# Patient Record
Sex: Female | Born: 1990
Health system: Southern US, Community
[De-identification: ages and names within clinical notes are randomized; demographics above are authoritative.]

## PROBLEM LIST (undated history)

## (undated) DIAGNOSIS — Z8619 Personal history of other infectious and parasitic diseases: Secondary | ICD-10-CM

## (undated) DIAGNOSIS — A630 Anogenital (venereal) warts: Secondary | ICD-10-CM

## (undated) DIAGNOSIS — G43109 Migraine with aura, not intractable, without status migrainosus: Secondary | ICD-10-CM

## (undated) DIAGNOSIS — E039 Hypothyroidism, unspecified: Secondary | ICD-10-CM

## (undated) DIAGNOSIS — E049 Nontoxic goiter, unspecified: Secondary | ICD-10-CM

## (undated) DIAGNOSIS — B002 Herpesviral gingivostomatitis and pharyngotonsillitis: Secondary | ICD-10-CM

## (undated) HISTORY — DX: Nontoxic goiter, unspecified: E04.9

## (undated) HISTORY — DX: Hypothyroidism, unspecified: E03.9

## (undated) HISTORY — PX: CYSTECTOMY: SUR359

## (undated) HISTORY — PX: OTHER SURGICAL HISTORY: SHX169

## (undated) HISTORY — DX: Herpesviral gingivostomatitis and pharyngotonsillitis: B00.2

## (undated) HISTORY — DX: Personal history of other infectious and parasitic diseases: Z86.19

## (undated) HISTORY — DX: Migraine with aura, not intractable, without status migrainosus: G43.109

## (undated) HISTORY — PX: WISDOM TOOTH EXTRACTION: SHX21

## (undated) HISTORY — DX: Anogenital (venereal) warts: A63.0

---
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
## 2008-05-12 ENCOUNTER — Encounter: Payer: Self-pay | Admitting: Family Medicine

## 2008-05-12 ENCOUNTER — Other Ambulatory Visit: Admission: RE | Admit: 2008-05-12 | Discharge: 2008-05-12 | Payer: Self-pay | Admitting: Family Medicine

## 2008-05-12 ENCOUNTER — Ambulatory Visit: Payer: Self-pay | Admitting: Family Medicine

## 2008-05-12 DIAGNOSIS — E049 Nontoxic goiter, unspecified: Secondary | ICD-10-CM

## 2008-05-23 ENCOUNTER — Encounter: Payer: Self-pay | Admitting: Family Medicine

## 2008-05-23 ENCOUNTER — Other Ambulatory Visit: Admission: RE | Admit: 2008-05-23 | Discharge: 2008-05-23 | Payer: Self-pay | Admitting: Family Medicine

## 2008-05-23 ENCOUNTER — Ambulatory Visit: Payer: Self-pay | Admitting: Family Medicine

## 2008-07-08 ENCOUNTER — Ambulatory Visit: Payer: Self-pay | Admitting: Family Medicine

## 2009-03-11 ENCOUNTER — Telehealth: Payer: Self-pay | Admitting: Family Medicine

## 2009-04-08 ENCOUNTER — Ambulatory Visit: Payer: Self-pay | Admitting: Family Medicine

## 2009-04-08 LAB — CONVERTED CEMR LAB
Nitrite: POSITIVE
Specific Gravity, Urine: 1.015
Urobilinogen, UA: 0.2
pH: 7

## 2009-06-23 ENCOUNTER — Ambulatory Visit: Payer: Self-pay | Admitting: Family Medicine

## 2009-06-23 LAB — CONVERTED CEMR LAB
KOH Prep: NEGATIVE
Whiff Test: NEGATIVE

## 2009-06-24 LAB — CONVERTED CEMR LAB
Chlamydia, DNA Probe: NEGATIVE
GC Probe Amp, Genital: NEGATIVE

## 2009-09-07 ENCOUNTER — Telehealth: Payer: Self-pay | Admitting: Family Medicine

## 2009-10-26 ENCOUNTER — Telehealth: Payer: Self-pay | Admitting: Family Medicine

## 2009-11-13 ENCOUNTER — Ambulatory Visit: Payer: Self-pay | Admitting: Family Medicine

## 2009-11-13 DIAGNOSIS — E039 Hypothyroidism, unspecified: Secondary | ICD-10-CM

## 2009-11-16 ENCOUNTER — Telehealth: Payer: Self-pay | Admitting: Family Medicine

## 2009-11-18 LAB — CONVERTED CEMR LAB
Free T4: 1.29 ng/dL (ref 0.80–1.80)
TSH: 1.218 microintl units/mL (ref 0.350–4.500)

## 2010-05-13 ENCOUNTER — Ambulatory Visit: Payer: Self-pay | Admitting: Family Medicine

## 2010-05-13 DIAGNOSIS — E7800 Pure hypercholesterolemia, unspecified: Secondary | ICD-10-CM

## 2010-05-14 LAB — CONVERTED CEMR LAB
Cholesterol: 141 mg/dL (ref 0–200)
HDL: 51.2 mg/dL (ref >39.00)
LDL Cholesterol: 80 mg/dL (ref 0–99)
TSH: 0.98 microintl units/mL (ref 0.35–5.50)
Total CHOL/HDL Ratio: 3

## 2010-06-03 ENCOUNTER — Telehealth: Payer: Self-pay | Admitting: Family Medicine

## 2010-07-13 ENCOUNTER — Ambulatory Visit: Payer: Self-pay | Admitting: Internal Medicine

## 2010-11-02 NOTE — Progress Notes (Signed)
Summary: thyroid  Phone Note Call from Patient Call back at [phone]   Caller: Mom Call For: Robin Part MD Summary of Call: Patients mother calling wanting to know when patient last had thyroid checked. I looked in chart but, do not see labs in there. Patient mother also states that daughter being swiched for brand name to generic and it is causing her alot of problams. Have not advised  mother of anything because i did not know about rather she was a contact for the patient since she is 20 yr old. Please advise Initial call taken by: Benny Lennert CMA Duncan Dull),  October 26, 2009 12:41 PM  Follow-up for Phone Call        she is due for visit and labs-- please call pt to schedule  Follow-up by: Robin Part MD,  October 26, 2009 1:40 PM  Additional Follow-up for Phone Call Additional follow up Details #1::        Advised pt's mother that pt needs to call to schedule office visit.    Additional Follow-up by: Lowella Petties CMA,  October 26, 2009 2:22 PM

## 2010-11-02 NOTE — Assessment & Plan Note (Signed)
Summary: F/U,LABWORK/CLE   Vital Signs:  Patient profile:   20 year old female Height:      66 inches Weight:      142.75 pounds BMI:     23.12 Temp:     98 degrees F oral Pulse rate:   72 / minute Pulse rhythm:   regular BP sitting:   100 / 60  (left arm) Cuff size:   regular  Vitals Entered By: Lewanda Rife LPN (November 13, 2009 4:01 PM)  History of Present Illness: here to check on thyroid  does not like generic synthroid   has been tired more than normal  extremely tired  is busy with work and school -- even if she does get 8 hours of sleep   not as much exercise as she used to  on feet at [employer] - not sitting at all  does eat a balanced diet and wt is the same   has been hypothyroid for 4-5 years  wants to switch to synthroid -- generic  was on it for a while   was switched to levothyroixine - ? 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 Eyes:  Denies blurring, double vision, and eye irritation. ENT:  Denies sore throat. CV:  Denies chest pain or discomfort, lightheadness, and palpitations. Resp:  Denies cough and shortness of breath. GI:  Denies abdominal pain, change in  bowel habits, and nausea. MS:  Denies joint pain. Derm:  Denies lesion(s), poor wound healing, and rash. Neuro:  Denies headaches, numbness, tingling, and weakness. Psych:  Denies anxiety and depression. Endo:  Denies cold intolerance, excessive thirst, excessive urination, and heat intolerance. Heme:  Denies abnormal bruising and bleeding.  Physical Exam  General:  Well-developed,well-nourished,in no acute distress; alert,appropriate and cooperative throughout examination Head:  normocephalic, atraumatic, and no abnormalities observed.   Eyes:  vision grossly intact, pupils equal, pupils round, and pupils reactive to light.   Mouth:  pharynx pink and moist.   Neck:  stable symmetric goiter that is nontender and without thyroid bruits  Lungs:  Normal respiratory effort, chest expands symmetrically. Lungs are clear to auscultation, no crackles or wheezes. Heart:  Normal rate and regular rhythm. S1 and S2 normal without gallop, murmur, click, rub or other extra sounds. Msk:  No deformity or scoliosis noted of thoracic or lumbar spine.   Extremities:  No clubbing, cyanosis, edema, or deformity noted with normal full range of motion of all joints.   Neurologic:  sensation intact to light touch, gait normal, and DTRs symmetrical and normal.   Skin:  Intact without suspicious lesions or rashes Cervical Nodes:  No lymphadenopathy noted Psych:  normal affect, talkative and pleasant  Impression & Recommendations:  Problem # 1:  HYPOTHYROIDISM (ICD-244.9) Assessment Unchanged  overall more fatigue with brand change pt desires generic synthroid  will check thyroid prof and then refil no change in goiter  then re check 6 mo  Her updated medication list for this problem includes:    Levoxyl 50 Mcg Tabs (Levothyroxine sodium) ..... One by mouth every day  Orders: Venipuncture (16109) T-TSH (60454-09811) T-T4, Free [phone]) T-T3 Uptake [phone]) Specimen Handling  (99000)  Complete Medication List: 1)  Valtrex 1 Gm Tabs (Valacyclovir hcl) .... 2 by mouth two times a day for 1 day as needed cold sore 2)  Zovirax 5 % Crea (Acyclovir) .... Apply to affected area four times daily as needed 3)  Levoxyl 50 Mcg Tabs (Levothyroxine sodium) .... One by mouth every day 4)  Tylenol 325 Mg Tabs (Acetaminophen) .... Otc as directed  Patient Instructions: 1)  labs today 2)  when labs return - will refil med as generic synthroid  3)  update me if any symptoms or problems  4)  schedule non fasting lab in 6 months for tsh , and lipids 272   Current Allergies (reviewed today): No known allergies

## 2010-11-02 NOTE — Progress Notes (Signed)
Summary: pt wants name brand synthroid  Phone Note Call from Patient Call back at [phone], [phone]   Caller: Patient Call For: Judith Part MD Summary of Call: Mother states pt needs to be taking name brand synthroid, not generic.  [facility]. Initial call taken by: Lowella Petties CMA,  November 16, 2009 11:30 AM  Follow-up for Phone Call        the PATIENT told me she wanted generic - just for the record  fine to change to DAW- but please warn her it may cost more px written on EMR for call in  Follow-up by: Judith Part MD,  November 16, 2009 11:52 AM  Additional Follow-up for Phone Call Additional follow up Details #1::        Spoke with pt's mom and she does want name brand. Medication [facility]oned [facility] as instructed. Spoke with Leonette Most at [facility] [employer].Lewanda Rife LPN  November 16, 2009 2:32 PM     New/Updated Medications: * SYNTHROID 50 MCG TABS (LEVOTHYROXINE SODIUM)  DAW 1 by mouth once daily [BMN] Prescriptions: SYNTHROID 50 MCG TABS (LEVOTHYROXINE SODIUM)  DAW 1 by mouth once daily Brand medically necessary #30 x 11   Entered and Authorized by:   Judith Part MD   Signed by:   Judith Part MD on 11/16/2009   Method used:   Telephoned to ...       [facility] [facility]ail)       [address]       [place], [place]  [address]       Ph: [phone] or [phone]       Fax: [phone]   RxID:   [id number]

## 2010-11-02 NOTE — Progress Notes (Signed)
Summary: Valacyclovir 1gm refill  Phone Note Refill Request Call back at [phone] Message from:  [facility] on June 03, 2010 3:41 PM  Refills Requested: Medication #1:  VALTREX 1 GM TABS 2 by mouth two times a day for 1 day as needed cold sore [facility] electronically request refill on Valacyclovir 1gm taking 2 tabs by mouth twice a day. No refill date sent. Please advise.    Method Requested: Telephone to Pharmacy Initial call taken by: Lewanda Rife LPN,  June 03, 2010 3:43 PM  Follow-up for Phone Call        px written on EMR for call in  Follow-up by: Judith Part MD,  June 04, 2010 8:03 AM  Additional Follow-up for Phone Call Additional follow up Details #1::        Sent to pharmacy. Additional Follow-up by: Lowella Petties CMA,  June 04, 2010 8:57 AM    Prescriptions: VALTREX 1 GM TABS (VALACYCLOVIR HCL) 2 by mouth two times a day for 1 day as needed cold sore  #4 x 2   Entered by:   Lowella Petties CMA   Authorized by:   Judith Part MD   Signed by:   Lowella Petties CMA on 06/04/2010   Method used:   Electronically to        [facility] (retail)       [address]       [place], [place]  [address]       Ph: [phone] or [phone]       Fax: [phone]   RxID:   [id number]

## 2010-11-02 NOTE — Assessment & Plan Note (Signed)
Summary: BUMP ON BACK OF LEG POSSIBLE SPIDER BITE???/JRR   Vital Signs:  Patient profile:   20 year old female Weight:      145.75 pounds Temp:     98.5 degrees F oral Pulse rate:   64 / minute Pulse rhythm:   regular BP sitting:   108 / 64  (left arm) Cuff size:   regular  Vitals Entered By: Selena Batten Dance CMA Duncan Dull) (July 13, 2010 4:08 PM) CC: check bump on leg   History of Present Illness: CC: spots on back of leg  several month h/o brown spot on back of R leg, then yesterday became bump, looked like whitehead.  Boyfriend popped it 2/2 pain, did get some pus out, still hurting.  Hasn't tried anything else.    Current Medications (verified): 1)  Valtrex 1 Gm Tabs (Valacyclovir Hcl) .... 2 By Mouth Two Times A Day For 1 Day As Needed Cold Sore 2)  Zovirax 5 % Crea (Acyclovir) .... Apply To Affected Area Four Times Daily As Needed 3)  Synthroid 50 Mcg Tabs (Levothyroxine Sodium)  Daw .... 1 By Mouth Once Daily 4)  Tylenol 325 Mg Tabs (Acetaminophen) .... 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  no fluctuance.  Small central dark spot on papule   Impression & Recommendations:  Problem # 1:  SKIN LESION (ICD-709.9) looks like solitary whitehead, irritated after popping last night at home.  given recent trauma to area, possible contamination and risk of MRSA, treat with doxy for 1 wk.  encouraged  to continue warm soaks for next several days, return if not imporving with these measures.  return sooner if coming to head.  Complete Medication List: 1)  Valtrex 1 Gm Tabs (Valacyclovir hcl) .... 2 by mouth two times a day for 1 day as needed cold sore 2)  Zovirax 5 % Crea (Acyclovir) .... Apply to affected area four times daily as needed 3)  Synthroid 50 Mcg Tabs (levothyroxine Sodium) Daw  .... 1 by mouth once daily 4)  Tylenol 325 Mg Tabs (Acetaminophen) .... Otc as directed 5)  Doxycycline Hyclate 100 Mg Caps (Doxycycline hyclate) .... Take one by mouth two times a day x 7 days  Patient Instructions: 1)  could have been whitehead, now looking irritated and inflammed. 2)  Recommend start warm compresses three times a day, return if seems like coming to a head. 3)  Course of antibiotics for next week (one twice daily) (photosensitivity precautions). Prescriptions: DOXYCYCLINE HYCLATE 100 MG CAPS (DOXYCYCLINE HYCLATE) take one by mouth two times a day x 7 days  #14 x 0   Entered and Authorized by:   Eustaquio Boyden  MD   Signed by:   Eustaquio Boyden  MD on 07/13/2010   Method used:   [facility]ronically to        [phone] (retail)       [address]       [place], [place]  [address]       Ph: [phone] or [phone]       Fax: [phone]   RxID:   [id number]   Current Allergies (reviewed today): No known allergies

## 2010-11-15 ENCOUNTER — Encounter: Payer: Self-pay | Admitting: Family Medicine

## 2010-11-15 ENCOUNTER — Other Ambulatory Visit: Payer: Self-pay | Admitting: Family Medicine

## 2010-11-15 ENCOUNTER — Ambulatory Visit (INDEPENDENT_AMBULATORY_CARE_PROVIDER_SITE_OTHER): Payer: PRIVATE HEALTH INSURANCE | Admitting: Family Medicine

## 2010-11-15 DIAGNOSIS — E049 Nontoxic goiter, unspecified: Secondary | ICD-10-CM

## 2010-11-15 DIAGNOSIS — E039 Hypothyroidism, unspecified: Secondary | ICD-10-CM

## 2010-11-15 DIAGNOSIS — E7800 Pure hypercholesterolemia, unspecified: Secondary | ICD-10-CM

## 2010-11-16 LAB — TSH: TSH: 0.74 u[IU]/mL (ref 0.35–5.50)

## 2010-11-24 NOTE — Assessment & Plan Note (Signed)
Summary: ROA FOR FOLLOW-UP/JRR   Vital Signs:  Patient profile:   20 year old female Height:      66 inches Weight:      149 pounds BMI:     24.14 Temp:     98 degrees F oral Pulse rate:   64 / minute Pulse rhythm:   regular BP sitting:   96 / 64  (left arm) Cuff size:   regular  Vitals Entered By: Lewanda Rife LPN (November 15, 2010 3:57 PM) CC: six month f/u   History of Present Illness: here for 6 month f/u of hypothyroidism and hyperlipidemia   feels good  is fine - no medical changes at all   wt is up 4 lb -- eating more in wintertime - stopped working out [employer]  bought a treadmill  also working  last tsh good in summer  does not feel like thyroid has changed in size or nature  no problems with med   Last Lipid ProfileCholesterol: 141 (05/13/2010 8:44:03 AM)HDL:  51.20 (05/13/2010 8:44:03 AM)LDL:  80 (05/13/2010 8:44:03 AM)Triglycerides:  Last Liver profileSGOT:  SPGT:  T. 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 Eyes:  Denies blurring and eye irritation. CV:  Denies chest pain or discomfort, fatigue, lightheadness, and palpitations. Resp:  Denies cough,  shortness of breath, and wheezing. GI:  Denies indigestion and nausea. GU:  Denies urinary frequency. MS:  Denies muscle aches and cramps. Derm:  Denies itching, lesion(s), poor wound healing, and rash. Neuro:  Denies numbness and tingling. Psych:  Denies anxiety and depression. Endo:  Denies cold intolerance, excessive thirst, excessive urination, and heat intolerance. Heme:  Denies abnormal bruising and bleeding.  Physical Exam  General:  Well-developed,well-nourished,in no acute distress; alert,appropriate and cooperative throughout examination Head:  normocephalic, atraumatic, and no abnormalities observed.   Eyes:  vision grossly intact, pupils equal, pupils round, and pupils reactive to light.  no conjunctival pallor, injection or icterus  Mouth:  pharynx pink and moist.   Neck:  stable symmetric goiter that is nontender and without thyroid bruits  Chest Wall:  No deformities, masses, or tenderness noted. Lungs:  Normal respiratory effort, chest expands symmetrically. Lungs are clear to auscultation, no crackles or wheezes. Heart:  Normal rate and regular rhythm. S1 and S2 normal without gallop, murmur, click, rub or other extra sounds. Extremities:  No clubbing, cyanosis, edema, or deformity noted with normal full range of motion of all joints.   Neurologic:  sensation intact to light touch, gait normal, and DTRs symmetrical and normal.  no tremor  Skin:  Intact without suspicious lesions or rashes Cervical Nodes:  No lymphadenopathy noted Inguinal Nodes:  No significant adenopathy Psych:  normal affect, talkative and pleasant    Impression & Recommendations:  Problem # 1:  HYPOTHYROIDISM (ICD-244.9) Assessment Improved  with goiter that is stable and unchanged no clinical changes- overall a bit more energy lab today and update Orders: Venipuncture (16109) TLB-TSH (Thyroid Stimulating Hormone) (84443-TSH) TLB-T4 (Thyrox), Free [phone])  Labs Reviewed: TSH: 0.98  (05/13/2010)    Chol: 141 (05/13/2010)   HDL: 51.20 (05/13/2010)   LDL: 80 (05/13/2010)   TG: 49.0 (05/13/2010)  Problem # 2:  PURE HYPERCHOLESTEROLEMIA (ICD-272.0) Assessment: Improved  this was excellent at last visit  urged to keep  up the good diet and add back exercise     HDL:51.20 (05/13/2010)  LDL:80 (05/13/2010)  Chol:141 (05/13/2010)  Trig:49.0 (05/13/2010)  Complete Medication List: 1)  Valtrex 1 Gm Tabs (Valacyclovir hcl) .... 2 by mouth two times a day for 1 day as needed cold sore 2)  Zovirax 5 % Crea (Acyclovir) .... Apply to affected area four times daily as needed 3)  Synthroid 50 Mcg Tabs (levothyroxine Sodium) Daw  .... 1 by mouth once daily 4)  Tylenol Extra Strength 500 Mg Tabs (Acetaminophen) .... Otc as directed.  Patient Instructions: 1)  labs today  2)  will advise you if we need to change dose 3)  get back to exercise  4)  if neck size changes let me know  Prescriptions: SYNTHROID 50 MCG TABS (LEVOTHYROXINE SODIUM)  DAW 1 by mouth once daily Brand medically necessary #30 x 11   Entered and Authorized by:   Judith Part MD   Signed by:   Judith Part MD on 11/15/2010   Method used:   Print then Give to Patient   RxID:   [id number]    Orders Added: 1)  Venipuncture [36415] 2)  TLB-TSH (Thyroid Stimulating Hormone) [84443-TSH] 3)  TLB-T4 (Thyrox), Free [57846-NG2X] 4)  Est. Patient Level III [52841]    Current Allergies (reviewed today): No known allergies

## 2011-02-18 NOTE — Op Note (Signed)
[place]. [facility]  Patient:    Robin Pollard                         MRN: [id number] Attending:  Evalee Mutton. Leeanne Mannan, M.D. CC:         Janeece Riggers, M.D.   Operative Report  DATE OF BIRTH:  03/15/1991.  PREOPERATIVE DIAGNOSIS:  Abdominal mass, possible ovarian cyst.  POSTOPERATIVE DIAGNOSIS:  Abdominal mass, possible ovarian cyst.  PROCEDURE: 1. Exploratory laparotomy and excision of ovarian cyst. 2. Appendectomy.  ANESTHESIA:  General endotracheal tube.  SURGEON:  Evalee Mutton. Leeanne Mannan, M.D.  ASSISTANTDonnella Bi D. Pendse, M.D.  INDICATION FOR PROCEDURE:  This 20 year old female child presented to her pediatrician with abdominal discomfort, who discovered a large abdominal mass, which appeared to be a cystic mass of possibly ovarian origin.  Preoperative preparations were done to do an exploratory laparotomy, excision of the mass.  DESCRIPTION OF PROCEDURE:  The patient was brought into the operating room and placed on the operating table.  Once general endotracheal anesthesia was given, the abdominal wall was cleaned and draped in the usual manner.  A curvilinear skin crease was made in the lower abdomen, measuring about 8-10 cm, deepened through the subcutaneous tissue, and the peritoneum was opened. The large cystic mass was instantly visible, which had its origin from the left ovary.  It was difficult to deliver it through the incision, limited by the skin incision, which was extended for about a centimeter on each side, and the mass was delivered out of the peritoneal cavity.  The mass measured over 20 cm in diameter, and tense cystic involving the entire left ovary.  The tube appeared to be intact and separate.  The right ovary and tube were inspected and found to be normal.  The uterus appeared a normal size.  The fluid was collected from the pelvis for cytology.  The rest of the viscera were also inspected.  The liver was palpated,  which was palpably and visibly normal. Both the kidneys were palpably normal.  No other abnormality or abdominal lymphadenitis was noted in the peritoneal cavity.  The cyst was easily separated, and the large vessels were clamped and divided between clamps and ligated with 2-0 silk, and the large cyst was removed from the field.  The left tube remained intact; however, the entire left ovary was involved with the cystic mass, which was excised.  At this point, we also decided to remove the appendix, and the appendix, which was long, tortuous, and retrocecal, was pulled out by pulling the cecum out and the mesoappendix was divided between clamps and ligated using 3-0 silk.  The base of the appendix was crushed, ligated with 2-0 chromic catgut.  The appendix was divided with a knife.  The stump of the appendix was buried under pursestring suture taken by 3-0 GI silk, and the stump was ducked into the cecum without any difficulty by tying the pursestring sutures.  The entire peritoneal cavity was irrigated with copious amount of normal saline, and the abdomen was once again inspected for any bleeders or oozers.  No bleeding or oozing was noted.  The abdominal cavity was closed in layers.  The peritoneal layer was closed with 2-0 Vicryl continuous sutures, the muscle of the sheath was sutured with 2-0 Vicryl interrupted sutures, the subcutaneous layer with 2-0 interrupted Vicryl sutures, and the skin with 4-0 Monocryl subcuticular stitch.  Steri-Strips were applied, securing  the skin.  The wound was irrigated with a copious amount of normal saline.  After closing the skin, the Steri-Strips were applied, which was covered with gauze dressing and OpSite.  Before applying the dressing, about 20 cc of 0.25% Marcaine with epinephrine was injected for postoperative pain control.  The patient tourniquet time very well, which was more than uneventful.  The estimated blood loss was less than 50 cc.   The total amount of IV fluids received by the patient was 800 cc.  The patient made about 150 cc of urine.  The course of the procedure was very smooth and uneventful.  The patient was later extubated and transported to the recovery room in a stable condition. DD:  05/16/00 TD:  05/16/00 Job: [id number]

## 2011-04-09 ENCOUNTER — Encounter: Payer: Self-pay | Admitting: Family Medicine

## 2011-04-11 ENCOUNTER — Ambulatory Visit (INDEPENDENT_AMBULATORY_CARE_PROVIDER_SITE_OTHER): Payer: PRIVATE HEALTH INSURANCE | Admitting: Family Medicine

## 2011-04-11 ENCOUNTER — Encounter: Payer: Self-pay | Admitting: Family Medicine

## 2011-04-11 VITALS — BP 100/70 | HR 68 | Temp 97.9°F | Ht 66.0 in | Wt 146.5 lb

## 2011-04-11 DIAGNOSIS — Z021 Encounter for pre-employment examination: Secondary | ICD-10-CM

## 2011-04-11 DIAGNOSIS — Z0289 Encounter for other administrative examinations: Secondary | ICD-10-CM

## 2011-04-11 DIAGNOSIS — Z23 Encounter for immunization: Secondary | ICD-10-CM

## 2011-04-11 MED ORDER — TUBERCULIN PPD 5 UNIT/0.1ML ID SOLN
5.0000 [IU] | Freq: Once | INTRADERMAL | Status: AC
  Administered 2011-04-11: 5 [IU] via INTRADERMAL

## 2011-04-11 NOTE — Progress Notes (Signed)
Subjective:    Patient ID: Robin Pollard, female    DOB: 1991/02/13, 20 y.o.   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 HUMAN PAPILLOMAVIRUS  . GOITER  . HYPOTHYROIDISM  . PURE HYPERCHOLESTEROLEMIA  . ABFND PAP SMEAR LGSIL  . Pre-employment examination   Past Medical History  Diagnosis Date  . Oral herpes   . Goiter     Hashimoto's thyroiditis  . HPV (human papilloma virus) anogenital infection     not high risk   Past Surgical History  Procedure Date  . Teratoma removal    History  Substance Use Topics  . Smoking status: Never Smoker   . Smokeless tobacco: Not on file  . Alcohol Use: No   Family History  Problem Relation Age of Onset  . Heart disease Maternal Grandmother     CAD  . Diabetes Maternal Grandmother   . Heart disease Maternal Grandfather     CAD  . Diabetes Maternal Grandfather   . Heart disease Paternal Grandfather     CAD   No Known Allergies Current Outpatient Prescriptions on File Prior to Visit  Medication Sig Dispense Refill  . levothyroxine (SYNTHROID, LEVOTHROID) 50 MCG tablet Take 50 mcg by mouth daily.        Marland Kitchen acetaminophen (TYLENOL) 500 MG tablet Take 500 mg by mouth every 6 (six) hours as needed.        Marland Kitchen acyclovir (ZOVIRAX) 5 % cream Apply to affected area four times daily as needed.       . valACYclovir (VALTREX) 1000 MG tablet Take 2,000 mg by mouth 2 (two) times daily. for 1 day as needed for cold  sore.             Review of Systems Review of Systems  Constitutional: Negative for fever, appetite change, fatigue and unexpected weight change.  Eyes: Negative for pain and visual disturbance.  Respiratory: Negative for cough and shortness of breath.   Cardiovascular: Negative.   Gastrointestinal: Negative for nausea, diarrhea and constipation.  Genitourinary: Negative for urgency and frequency.  Skin: Negative for pallor.  Neurological: Negative for weakness, light-headedness, numbness and headaches.  Hematological: Negative for adenopathy. Does not bruise/bleed easily.  Psychiatric/Behavioral: Negative for dysphoric mood. The patient is not nervous/anxious.          Objective:   Physical Exam  Constitutional: She appears well-developed and well-nourished. No distress.  HENT:  Head: Normocephalic and atraumatic.  Right Ear: External ear normal.  Left Ear: External ear normal.  Nose: Nose normal.  Mouth/Throat: Oropharynx is clear and moist.  Eyes: Conjunctivae and EOM are normal. Pupils are equal, round, and reactive to light.  Neck: Normal range of motion. Neck supple. No JVD present. Carotid bruit is not present. No thyromegaly present.  Cardiovascular: Normal rate, regular rhythm, normal heart  sounds and intact distal pulses.   No murmur heard. Pulmonary/Chest: Effort normal and breath sounds normal. No respiratory distress. She has no wheezes.  Abdominal: Soft. Bowel sounds are normal. She exhibits no distension and no mass. There is no tenderness.  Musculoskeletal: Normal range of motion. She exhibits no edema and no tenderness.  Lymphadenopathy:    She has no cervical adenopathy.  Neurological: She is alert. She has normal reflexes. Coordination normal.  Skin: Skin is warm and dry. No rash noted. No erythema. No pallor.  Psychiatric: She has a normal mood and affect.          Assessment & Plan:

## 2011-04-11 NOTE — Assessment & Plan Note (Signed)
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

## 2011-04-11 NOTE — Patient Instructions (Signed)
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

## 2011-04-13 ENCOUNTER — Ambulatory Visit (INDEPENDENT_AMBULATORY_CARE_PROVIDER_SITE_OTHER): Payer: PRIVATE HEALTH INSURANCE | Admitting: Family Medicine

## 2011-04-13 DIAGNOSIS — Z111 Encounter for screening for respiratory tuberculosis: Secondary | ICD-10-CM

## 2011-04-13 DIAGNOSIS — IMO0001 Reserved for inherently not codable concepts without codable children: Secondary | ICD-10-CM

## 2011-04-14 NOTE — Progress Notes (Signed)
  Subjective:    Patient ID: Robin Pollard, female    DOB: Feb 26, 1991, 20 y.o.   MRN: [id number]  HPI  Neg PPD  Review of Systems     Objective:   Physical Exam        Assessment & Plan:

## 2011-06-27 LAB — POCT RAPID STREP A: Streptococcus, Group A Screen (Direct): NEGATIVE

## 2011-12-05 ENCOUNTER — Other Ambulatory Visit: Payer: Self-pay | Admitting: *Deleted

## 2011-12-05 MED ORDER — LEVOTHYROXINE SODIUM 50 MCG PO TABS: 50.0000 ug | ORAL_TABLET | Freq: Every day | ORAL | Status: DC

## 2012-01-09 ENCOUNTER — Telehealth: Payer: Self-pay | Admitting: Family Medicine

## 2012-01-09 NOTE — Telephone Encounter (Signed)
Pt is calling in about Nausea. She is about [redacted] weeks pregnant and is extremely nauseated and has been sick pretty much all day for the last 3 or so days. She has an appointment with Dr. Vincente Poli on 01/17/12 but wasn't sure if she should call their office or here to see about getting something for her nausea.

## 2012-01-09 NOTE — Telephone Encounter (Signed)
Call the OBGYN office to see what they recommend that is the safest  Keep taking sips of fluids most importantly to prevent dehydration

## 2012-01-09 NOTE — Telephone Encounter (Signed)
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

## 2012-01-26 LAB — OB RESULTS CONSOLE ABO/RH

## 2012-01-26 LAB — OB RESULTS CONSOLE HEPATITIS B SURFACE ANTIGEN: Hepatitis B Surface Ag: NEGATIVE

## 2012-01-26 LAB — OB RESULTS CONSOLE ANTIBODY SCREEN: Antibody Screen: NEGATIVE

## 2012-02-09 ENCOUNTER — Other Ambulatory Visit: Payer: Self-pay | Admitting: Obstetrics and Gynecology

## 2012-02-10 ENCOUNTER — Other Ambulatory Visit: Payer: Self-pay | Admitting: *Deleted

## 2012-02-10 DIAGNOSIS — E039 Hypothyroidism, unspecified: Secondary | ICD-10-CM

## 2012-02-10 MED ORDER — LEVOTHYROXINE SODIUM 50 MCG PO TABS: 50.0000 ug | ORAL_TABLET | Freq: Every day | ORAL | Status: DC

## 2012-02-10 NOTE — Telephone Encounter (Signed)
OK to refill? No recent labs. 

## 2012-02-10 NOTE — Telephone Encounter (Signed)
Please schedule labs for thyroid  Have her f/u this summer Will refill electronically

## 2012-02-13 ENCOUNTER — Other Ambulatory Visit (INDEPENDENT_AMBULATORY_CARE_PROVIDER_SITE_OTHER): Payer: BC Managed Care – PPO

## 2012-02-13 DIAGNOSIS — E039 Hypothyroidism, unspecified: Secondary | ICD-10-CM

## 2012-02-13 NOTE — Telephone Encounter (Signed)
Left message on cell phone voicemail advising patient as instructed.  Advised her to call back and schedule lab appt.

## 2012-02-14 LAB — TSH: TSH: 0.81 u[IU]/mL (ref 0.35–5.50)

## 2012-03-21 ENCOUNTER — Ambulatory Visit: Payer: PRIVATE HEALTH INSURANCE | Admitting: Family Medicine

## 2012-06-20 ENCOUNTER — Telehealth: Payer: Self-pay | Admitting: Family Medicine

## 2012-06-20 MED ORDER — VALACYCLOVIR HCL 1 G PO TABS: 2000.0000 mg | ORAL_TABLET | Freq: Two times a day (BID) | ORAL | Status: DC

## 2012-06-20 MED ORDER — ACYCLOVIR 5 % EX CREA: TOPICAL_CREAM | CUTANEOUS | Status: DC

## 2012-06-20 NOTE — Telephone Encounter (Signed)
Pt informed me by her sister who works here she needs refil on cold sore medicines- valtrex and zovirax cream Sent to [facility] [place] Will let her sister know I sent it

## 2012-08-29 ENCOUNTER — Telehealth (HOSPITAL_COMMUNITY): Payer: Self-pay | Admitting: *Deleted

## 2012-08-29 ENCOUNTER — Encounter (HOSPITAL_COMMUNITY): Payer: Self-pay | Admitting: *Deleted

## 2012-08-29 NOTE — Telephone Encounter (Signed)
Preadmission screen  

## 2012-08-31 ENCOUNTER — Encounter (HOSPITAL_COMMUNITY): Payer: Self-pay

## 2012-08-31 ENCOUNTER — Inpatient Hospital Stay (HOSPITAL_COMMUNITY)
Admission: AD | Admit: 2012-08-31 | Discharge: 2012-09-03 | DRG: 372 | Disposition: A | Discharge status: Home / Self Care | Payer: BC Managed Care – PPO | Source: Ambulatory Visit | Attending: Obstetrics & Gynecology | Admitting: Obstetrics & Gynecology

## 2012-08-31 DIAGNOSIS — E049 Nontoxic goiter, unspecified: Secondary | ICD-10-CM

## 2012-08-31 DIAGNOSIS — B977 Papillomavirus as the cause of diseases classified elsewhere: Secondary | ICD-10-CM

## 2012-08-31 DIAGNOSIS — E7800 Pure hypercholesterolemia, unspecified: Secondary | ICD-10-CM

## 2012-08-31 DIAGNOSIS — Z021 Encounter for pre-employment examination: Secondary | ICD-10-CM

## 2012-08-31 DIAGNOSIS — R87612 Low grade squamous intraepithelial lesion on cytologic smear of cervix (LGSIL): Secondary | ICD-10-CM

## 2012-08-31 DIAGNOSIS — E039 Hypothyroidism, unspecified: Secondary | ICD-10-CM

## 2012-08-31 DIAGNOSIS — O4290 Premature rupture of membranes, unspecified as to length of time between rupture and onset of labor, unspecified weeks of gestation: Secondary | ICD-10-CM | POA: Diagnosis present

## 2012-08-31 LAB — CBC
HCT: 38.6 % (ref 36.0–46.0)
Hemoglobin: 13.2 g/dL (ref 12.0–15.0)
MCV: 88.1 fL (ref 78.0–100.0)
RBC: 4.38 MIL/uL (ref 3.87–5.11)
RDW: 13.3 % (ref 11.5–15.5)
WBC: 13.5 10*3/uL — ABNORMAL HIGH (ref 4.0–10.5)

## 2012-08-31 MED ORDER — FENTANYL 2.5 MCG/ML BUPIVACAINE 1/10 % EPIDURAL INFUSION (WH - ANES)
14.0000 mL/h | INTRAMUSCULAR | Status: DC
  Administered 2012-09-01: 14 mL/h via EPIDURAL
  Filled 2012-08-31: qty 125

## 2012-08-31 MED ORDER — LACTATED RINGERS IV SOLN: 500.0000 mL | Freq: Once | INTRAVENOUS | Status: DC

## 2012-08-31 MED ORDER — CITRIC ACID-SODIUM CITRATE 334-500 MG/5ML PO SOLN: 30.0000 mL | ORAL | Status: DC | PRN

## 2012-08-31 MED ORDER — PHENYLEPHRINE 40 MCG/ML (10ML) SYRINGE FOR IV PUSH (FOR BLOOD PRESSURE SUPPORT): 80.0000 ug | PREFILLED_SYRINGE | INTRAVENOUS | Status: DC | PRN

## 2012-08-31 MED ORDER — OXYCODONE-ACETAMINOPHEN 5-325 MG PO TABS: 1.0000 | ORAL_TABLET | ORAL | Status: DC | PRN

## 2012-08-31 MED ORDER — LIDOCAINE HCL (PF) 1 % IJ SOLN
30.0000 mL | INTRAMUSCULAR | Status: DC | PRN
  Filled 2012-08-31: qty 30

## 2012-08-31 MED ORDER — OXYTOCIN 40 UNITS IN LACTATED RINGERS INFUSION - SIMPLE MED
62.5000 mL/h | INTRAVENOUS | Status: DC
  Filled 2012-08-31: qty 1000

## 2012-08-31 MED ORDER — EPHEDRINE 5 MG/ML INJ
10.0000 mg | INTRAVENOUS | Status: DC | PRN
  Filled 2012-08-31: qty 4

## 2012-08-31 MED ORDER — ACETAMINOPHEN 325 MG PO TABS: 650.0000 mg | ORAL_TABLET | ORAL | Status: DC | PRN

## 2012-08-31 MED ORDER — LACTATED RINGERS IV SOLN
500.0000 mL | INTRAVENOUS | Status: DC | PRN
  Administered 2012-09-01: 500 mL via INTRAVENOUS

## 2012-08-31 MED ORDER — FLEET ENEMA 7-19 GM/118ML RE ENEM: 1.0000 | ENEMA | RECTAL | Status: DC | PRN

## 2012-08-31 MED ORDER — OXYTOCIN BOLUS FROM INFUSION
500.0000 mL | INTRAVENOUS | Status: DC
  Administered 2012-09-01: 500 mL via INTRAVENOUS

## 2012-08-31 MED ORDER — IBUPROFEN 600 MG PO TABS: 600.0000 mg | ORAL_TABLET | Freq: Four times a day (QID) | ORAL | Status: DC | PRN

## 2012-08-31 MED ORDER — TERBUTALINE SULFATE 1 MG/ML IJ SOLN: 0.2500 mg | Freq: Once | INTRAMUSCULAR | Status: AC | PRN

## 2012-08-31 MED ORDER — EPHEDRINE 5 MG/ML INJ: 10.0000 mg | INTRAVENOUS | Status: DC | PRN

## 2012-08-31 MED ORDER — MISOPROSTOL 25 MCG QUARTER TABLET
50.0000 ug | ORAL_TABLET | ORAL | Status: DC | PRN
  Administered 2012-08-31 (×2): 50 ug via ORAL
  Filled 2012-08-31 (×2): qty 0.5

## 2012-08-31 MED ORDER — PHENYLEPHRINE 40 MCG/ML (10ML) SYRINGE FOR IV PUSH (FOR BLOOD PRESSURE SUPPORT)
80.0000 ug | PREFILLED_SYRINGE | INTRAVENOUS | Status: DC | PRN
  Filled 2012-08-31: qty 5

## 2012-08-31 MED ORDER — BUTORPHANOL TARTRATE 1 MG/ML IJ SOLN
1.0000 mg | Freq: Once | INTRAMUSCULAR | Status: AC
  Administered 2012-08-31: 1 mg via INTRAVENOUS
  Filled 2012-08-31: qty 1

## 2012-08-31 MED ORDER — LACTATED RINGERS IV SOLN
INTRAVENOUS | Status: DC
  Administered 2012-08-31: 19:00:00 via INTRAVENOUS

## 2012-08-31 MED ORDER — ONDANSETRON HCL 4 MG/2ML IJ SOLN
4.0000 mg | Freq: Four times a day (QID) | INTRAMUSCULAR | Status: DC | PRN
  Filled 2012-08-31: qty 2

## 2012-08-31 MED ORDER — ONDANSETRON HCL 4 MG/2ML IJ SOLN
4.0000 mg | Freq: Once | INTRAMUSCULAR | Status: AC
  Administered 2012-08-31: 4 mg via INTRAVENOUS

## 2012-08-31 MED ORDER — DIPHENHYDRAMINE HCL 50 MG/ML IJ SOLN: 12.5000 mg | INTRAMUSCULAR | Status: DC | PRN

## 2012-08-31 MED ORDER — ZOLPIDEM TARTRATE 5 MG PO TABS: 5.0000 mg | ORAL_TABLET | Freq: Every evening | ORAL | Status: DC | PRN

## 2012-08-31 NOTE — MAU Note (Signed)
Around 11:45 felt some vaginal discharge come out still having some vaginal discharge yellowish in color, ? Contractions

## 2012-09-01 ENCOUNTER — Encounter (HOSPITAL_COMMUNITY): Payer: Self-pay | Admitting: *Deleted

## 2012-09-01 ENCOUNTER — Encounter (HOSPITAL_COMMUNITY): Payer: Self-pay | Admitting: Anesthesiology

## 2012-09-01 ENCOUNTER — Inpatient Hospital Stay (HOSPITAL_COMMUNITY): Payer: BC Managed Care – PPO | Admitting: Anesthesiology

## 2012-09-01 LAB — TYPE AND SCREEN
ABO/RH(D): A POS
Antibody Screen: NEGATIVE

## 2012-09-01 MED ORDER — BENZOCAINE-MENTHOL 20-0.5 % EX AERO
1.0000 "application " | INHALATION_SPRAY | CUTANEOUS | Status: DC | PRN
  Administered 2012-09-01: 1 via TOPICAL
  Filled 2012-09-01: qty 56

## 2012-09-01 MED ORDER — TETANUS-DIPHTH-ACELL PERTUSSIS 5-2.5-18.5 LF-MCG/0.5 IM SUSP: 0.5000 mL | Freq: Once | INTRAMUSCULAR | Status: DC

## 2012-09-01 MED ORDER — SIMETHICONE 80 MG PO CHEW: 80.0000 mg | CHEWABLE_TABLET | ORAL | Status: DC | PRN

## 2012-09-01 MED ORDER — ONDANSETRON HCL 4 MG/2ML IJ SOLN: 4.0000 mg | INTRAMUSCULAR | Status: DC | PRN

## 2012-09-01 MED ORDER — OXYCODONE-ACETAMINOPHEN 5-325 MG PO TABS
1.0000 | ORAL_TABLET | ORAL | Status: DC | PRN
  Administered 2012-09-01 – 2012-09-02 (×2): 1 via ORAL
  Filled 2012-09-01 (×2): qty 1

## 2012-09-01 MED ORDER — OXYTOCIN 40 UNITS IN LACTATED RINGERS INFUSION - SIMPLE MED: 1.0000 m[IU]/min | INTRAVENOUS | Status: DC

## 2012-09-01 MED ORDER — WITCH HAZEL-GLYCERIN EX PADS: 1.0000 "application " | MEDICATED_PAD | CUTANEOUS | Status: DC | PRN

## 2012-09-01 MED ORDER — ONDANSETRON HCL 4 MG PO TABS: 4.0000 mg | ORAL_TABLET | ORAL | Status: DC | PRN

## 2012-09-01 MED ORDER — SENNOSIDES-DOCUSATE SODIUM 8.6-50 MG PO TABS
2.0000 | ORAL_TABLET | Freq: Every day | ORAL | Status: DC
  Administered 2012-09-01 – 2012-09-02 (×2): 2 via ORAL

## 2012-09-01 MED ORDER — LIDOCAINE HCL (PF) 1 % IJ SOLN
INTRAMUSCULAR | Status: DC | PRN
  Administered 2012-09-01 (×2): 5 mL

## 2012-09-01 MED ORDER — LEVOTHYROXINE SODIUM 50 MCG PO TABS
50.0000 ug | ORAL_TABLET | Freq: Every day | ORAL | Status: DC
  Administered 2012-09-03: 50 ug via ORAL
  Filled 2012-09-01 (×2): qty 1

## 2012-09-01 MED ORDER — DIBUCAINE 1 % RE OINT: 1.0000 "application " | TOPICAL_OINTMENT | RECTAL | Status: DC | PRN

## 2012-09-01 MED ORDER — TERBUTALINE SULFATE 1 MG/ML IJ SOLN: 0.2500 mg | Freq: Once | INTRAMUSCULAR | Status: DC | PRN

## 2012-09-01 MED ORDER — IBUPROFEN 600 MG PO TABS
600.0000 mg | ORAL_TABLET | Freq: Four times a day (QID) | ORAL | Status: DC
  Administered 2012-09-01 – 2012-09-03 (×8): 600 mg via ORAL
  Filled 2012-09-01 (×8): qty 1

## 2012-09-01 MED ORDER — PRENATAL MULTIVITAMIN CH
1.0000 | ORAL_TABLET | Freq: Every day | ORAL | Status: DC
  Administered 2012-09-01 – 2012-09-03 (×3): 1 via ORAL
  Filled 2012-09-01 (×2): qty 1

## 2012-09-01 MED ORDER — LANOLIN HYDROUS EX OINT: TOPICAL_OINTMENT | CUTANEOUS | Status: DC | PRN

## 2012-09-01 MED ORDER — ZOLPIDEM TARTRATE 5 MG PO TABS: 5.0000 mg | ORAL_TABLET | Freq: Every evening | ORAL | Status: DC | PRN

## 2012-09-01 MED ORDER — DIPHENHYDRAMINE HCL 25 MG PO CAPS: 25.0000 mg | ORAL_CAPSULE | Freq: Four times a day (QID) | ORAL | Status: DC | PRN

## 2012-09-01 NOTE — Progress Notes (Signed)
Provider made aware of pt's progress: SVE, uterine contraction pattern, FHT and pain level. Will continue to monitor.

## 2012-09-01 NOTE — Anesthesia Preprocedure Evaluation (Signed)
Anesthesia Evaluation  Patient identified by MRN, date of birth, ID band Patient awake    Reviewed: Allergy & Precautions, H&P , Patient's Chart, lab work & pertinent test results  Airway Mallampati: II TM Distance: >3 FB Neck ROM: full    Dental No notable dental hx.    Pulmonary neg pulmonary ROS,  breath sounds clear to auscultation  Pulmonary exam normal       Cardiovascular negative cardio ROS  Rhythm:regular Rate:Normal     Neuro/Psych negative neurological ROS  negative psych ROS   GI/Hepatic negative GI ROS, Neg liver ROS,   Endo/Other  negative endocrine ROSHypothyroidism   Renal/GU negative Renal ROS     Musculoskeletal   Abdominal   Peds  Hematology negative hematology ROS (+)   Anesthesia Other Findings Oral herpes     Goiter   Hashimoto's thyroiditis    HPV (human papilloma virus) anogenital infection   not high risk H/O varicella        Hypothyroidism                 Reproductive/Obstetrics (+) Pregnancy                           Anesthesia Physical Anesthesia Plan  ASA: II  Anesthesia Plan: Epidural   Post-op Pain Management:    Induction:   Airway Management Planned:   Additional Equipment:   Intra-op Plan:   Post-operative Plan:   Informed Consent: I have reviewed the patients History and Physical, chart, labs and discussed the procedure including the risks, benefits and alternatives for the proposed anesthesia with the patient or authorized representative who has indicated his/her understanding and acceptance.     Plan Discussed with:   Anesthesia Plan Comments:         Anesthesia Quick Evaluation

## 2012-09-01 NOTE — Progress Notes (Signed)
Provider made aware of pt pain level and SVE. New orders given.

## 2012-09-01 NOTE — Progress Notes (Signed)
SVD of VFI wt and pH pending.  APGARs 2, 7.  Meconium stained fluid. Head delivered LOA with mouth and nose bulb suctioned.  Body delivered atraumatically.  Cord clamped, cut and baby to warmer for resuscitation.  Cord pH obtained.  Placenta delivered S/I/3VC and sent to path.  Fundus firmed with pitocin and massage.  First degree perineal laceration repaired in the normal fashion with 3-0 Rapide.  NICU team was called to attend delivery secondary to meconium stained fluid.  A few puffs of blow-by were required but otherwise transitioned well.  Mom and baby stable.    Mitchel Honour, DO

## 2012-09-01 NOTE — Progress Notes (Signed)
Patient comfortable with epidural but feeling rectal pressure. VSS Toco q 2-3 min FHT 150 SVE: c/c/+2 21yo G1 at [redacted]w[redacted]d with PROM Anticipate delivery soon  Mitchel Honour, DO

## 2012-09-01 NOTE — Anesthesia Postprocedure Evaluation (Signed)
Anesthesia Post Note  Patient: Robin Pollard  Procedure(s) Performed: * No procedures listed *  Anesthesia type: Epidural  Patient location: Mother/Baby  Post pain: Pain level controlled  Post assessment: Post-op Vital signs reviewed  Last Vitals:  Filed Vitals:   09/01/12 1129  BP: 110/72  Pulse: 86  Temp: 37.4 C  Resp: 20    Post vital signs: Reviewed  Level of consciousness:alert  Complications: No apparent anesthesia complications

## 2012-09-01 NOTE — Anesthesia Procedure Notes (Signed)
Epidural Patient location during procedure: OB Start time: 09/01/2012 1:54 AM  Staffing Anesthesiologist: Brayton Caves R Performed by: anesthesiologist   Preanesthetic Checklist Completed: patient identified, site marked, surgical consent, pre-op evaluation, timeout performed, IV checked, risks and benefits discussed and monitors and equipment checked  Epidural Patient position: sitting Prep: site prepped and draped and DuraPrep Patient monitoring: continuous pulse ox and blood pressure Approach: midline Injection technique: LOR air and LOR saline  Needle:  Needle type: Tuohy  Needle gauge: 17 G Needle length: 9 cm and 9 Needle insertion depth: 5 cm cm Catheter type: closed end flexible Catheter size: 19 Gauge Catheter at skin depth: 10 cm Test dose: negative  Assessment Events: blood not aspirated, injection not painful, no injection resistance, negative IV test and no paresthesia  Additional Notes Patient identified.  Risk benefits discussed including failed block, incomplete pain control, headache, nerve damage, paralysis, blood pressure changes, nausea, vomiting, reactions to medication both toxic or allergic, and postpartum back pain.  Patient expressed understanding and wished to proceed.  All questions were answered.  Sterile technique used throughout procedure and epidural site dressed with sterile barrier dressing. No paresthesia or other complications noted.The patient did not experience any signs of intravascular injection such as tinnitus or metallic taste in mouth nor signs of intrathecal spread such as rapid motor block. Please see nursing notes for vital signs.

## 2012-09-01 NOTE — H&P (Signed)
Robin Pollard is a 21 y.o. female presenting for PROM at 1145 on 11/29.  She denies significant CTX.  +FM.  No VB.   Maternal Medical History:  Reason for admission: Reason for admission: rupture of membranes.  Contractions: Onset was 6-12 hours ago.   Frequency: irregular.   Perceived severity is mild.    Fetal activity: Perceived fetal activity is normal.   Last perceived fetal movement was within the past hour.    Prenatal complications: no prenatal complications Prenatal Complications - Diabetes: none.    OB History    Grav Para Term Preterm Abortions TAB SAB Ect Mult Living   1 0 0 0 0 0 0 0 0 0      Past Medical History  Diagnosis Date  . Oral herpes   . Goiter     Hashimoto's thyroiditis  . HPV (human papilloma virus) anogenital infection     not high risk  . H/O varicella   . Hypothyroidism    Past Surgical History  Procedure Date  . Teratoma removal   . Cystectomy     dermoid cyst  . Oophorectomy 2001    left   Family History: family history includes Cancer in her mother; Diabetes in her maternal grandfather, paternal grandfather, and paternal grandmother; Heart disease in her maternal grandmother, paternal grandfather, and paternal grandmother; Hypertension in her mother, paternal grandfather, and paternal grandmother; Urolithiasis in her mother and sister; and Phillips Odor White syndrome in her mother. Social History:  reports that she has never smoked. She has never used smokeless tobacco. She reports that she does not drink alcohol. Her drug history not on file.   Prenatal Transfer Tool  Maternal Diabetes: No Genetic Screening: Normal Maternal Ultrasounds/Referrals: Normal Fetal Ultrasounds or other Referrals:  None Maternal Substance Abuse:  No Significant Maternal Medications:  Meds include: Syntroid Significant Maternal Lab Results:  None Other Comments:  None  ROS  Dilation: 10 Effacement (%): 100 Station: +2 Exam by:: Dr Langston Masker Blood  pressure 117/77, pulse 108, temperature 98 F (36.7 C), temperature source Oral, resp. rate 18, height 5\' 6"  (1.676 m), weight 209 lb (94.802 kg). Maternal Exam:  Uterine Assessment: Contraction strength is mild.  Contraction frequency is irregular.   Abdomen: Patient reports no abdominal tenderness. Fundal height is c/w dates.   Estimated fetal weight is 7#12.   Fetal presentation: vertex  Introitus: Normal vulva. Normal vagina.  Ferning test: positive.  Nitrazine test: positive. Amniotic fluid character: meconium stained.  Pelvis: adequate for delivery.   Cervix: Cervix evaluated by digital exam.     Physical Exam  Constitutional: She is oriented to person, place, and time. She appears well-developed and well-nourished.  GI: Soft. Bowel sounds are normal.  Genitourinary: Vagina normal and uterus normal.  Musculoskeletal: Normal range of motion.  Neurological: She is alert and oriented to person, place, and time.  Skin: Skin is warm and dry.  Psychiatric: She has a normal mood and affect. Her behavior is normal.    Prenatal labs: ABO, Rh: A/Positive/-- (04/25 0000) Antibody: Negative (04/25 0000) Rubella: Equivocal (04/25 0000) RPR: NON REACTIVE (11/29 1830)  HBsAg: Negative (04/25 0000)  HIV: Non-reactive (04/25 0000)  GBS: Negative (10/29 0000)   Assessment/Plan: 21yo G1 at [redacted]w[redacted]d with PROM -Augment and ripen with VMP -Epidural when desired -GBS negative   Maheen Cwikla 09/01/2012, 7:22 AM

## 2012-09-02 LAB — CBC
MCH: 30.2 pg (ref 26.0–34.0)
MCV: 89.2 fL (ref 78.0–100.0)
Platelets: 142 10*3/uL — ABNORMAL LOW (ref 150–400)
RBC: 3.71 MIL/uL — ABNORMAL LOW (ref 3.87–5.11)

## 2012-09-02 NOTE — Progress Notes (Signed)
Post Partum Day 1 Subjective: no complaints, up ad lib, voiding and tolerating PO  Objective: Blood pressure 108/60, pulse 78, temperature 97.2 F (36.2 C), temperature source Oral, resp. rate 18, height 5\' 6"  (1.676 m), weight 209 lb (94.802 kg), SpO2 98.00%, unknown if currently breastfeeding.  Physical Exam:  General: alert, cooperative and appears stated age Lochia: appropriate Uterine Fundus: firm Incision: healing well DVT Evaluation: No evidence of DVT seen on physical exam. Negative Homan's sign. No cords or calf tenderness.   Basename 09/02/12 0525 08/31/12 1830  HGB 11.2* 13.2  HCT 33.1* 38.6    Assessment/Plan: Plan for discharge tomorrow and Breastfeeding   LOS: 2 days   Robin Pollard 09/02/2012, 7:37 AM

## 2012-09-03 MED ORDER — OXYCODONE-ACETAMINOPHEN 5-325 MG PO TABS: 1.0000 | ORAL_TABLET | ORAL | Status: DC | PRN

## 2012-09-03 MED ORDER — MEASLES, MUMPS & RUBELLA VAC ~~LOC~~ INJ
0.5000 mL | INJECTION | Freq: Once | SUBCUTANEOUS | Status: AC
  Administered 2012-09-03: 0.5 mL via SUBCUTANEOUS
  Filled 2012-09-03: qty 0.5

## 2012-09-03 MED ORDER — IBUPROFEN 600 MG PO TABS: 600.0000 mg | ORAL_TABLET | Freq: Four times a day (QID) | ORAL | Status: DC

## 2012-09-03 NOTE — Discharge Summary (Signed)
Obstetric Discharge Summary Reason for Admission: rupture of membranes Prenatal Procedures: ultrasound Intrapartum Procedures: spontaneous vaginal delivery Postpartum Procedures: none Complications-Operative and Postpartum: 1 degree perineal laceration Hemoglobin  Date Value Range Status  09/02/2012 11.2* 12.0 - 15.0 g/dL Final     HCT  Date Value Range Status  09/02/2012 33.1* 36.0 - 46.0 % Final    Physical Exam:  General: alert and cooperative Lochia: appropriate Uterine Fundus: firm Incision: perineum intact DVT Evaluation: No evidence of DVT seen on physical exam. No significant calf/ankle edema.  Discharge Diagnoses: Term Pregnancy-delivered  Discharge Information: Date: 09/03/2012 Activity: pelvic rest Diet: routine Medications: PNV, Ibuprofen and Percocet Condition: stable Instructions: refer to practice specific booklet Discharge to: home   Newborn Data: Live born female  Birth Weight: 8 lb 9.2 oz (3890 g) APGAR: 2, 7  Home with mother.  Robin Pollard G 09/03/2012, 8:24 AM

## 2012-09-04 ENCOUNTER — Inpatient Hospital Stay (HOSPITAL_COMMUNITY): Admission: RE | Admit: 2012-09-04 | Payer: BC Managed Care – PPO | Source: Ambulatory Visit

## 2012-10-04 ENCOUNTER — Other Ambulatory Visit: Payer: Self-pay | Admitting: Family Medicine

## 2012-10-04 NOTE — Telephone Encounter (Signed)
Please schedule follow up in may and refil until then

## 2012-10-04 NOTE — Telephone Encounter (Signed)
Ok to refill? No recent appt and no future appt 

## 2012-10-04 NOTE — Telephone Encounter (Signed)
Pt's daughter has appt on 10/08/12 with Dr. Milinda Antis, pt request her f/u appt on same day. appt schedule for 10/08/12 and med refilled for a month

## 2012-10-08 ENCOUNTER — Encounter: Payer: Self-pay | Admitting: Family Medicine

## 2012-10-08 ENCOUNTER — Ambulatory Visit (INDEPENDENT_AMBULATORY_CARE_PROVIDER_SITE_OTHER): Payer: BC Managed Care – PPO | Admitting: Family Medicine

## 2012-10-08 ENCOUNTER — Ambulatory Visit: Payer: BC Managed Care – PPO | Admitting: Family Medicine

## 2012-10-08 VITALS — BP 102/68 | HR 73 | Temp 97.4°F | Ht 66.0 in | Wt 183.8 lb

## 2012-10-08 DIAGNOSIS — E039 Hypothyroidism, unspecified: Secondary | ICD-10-CM

## 2012-10-08 MED ORDER — LEVOTHYROXINE SODIUM 50 MCG PO TABS: 50.0000 ug | ORAL_TABLET | Freq: Every day | ORAL | Status: DC

## 2012-10-08 NOTE — Patient Instructions (Signed)
I'm glad you are doing well  Labs today for thyroid  Update me if any problem

## 2012-10-08 NOTE — Progress Notes (Signed)
Subjective:    Patient ID: Robin Pollard, female    DOB: Feb 01, 1991, 22 y.o.   MRN: [id number]  HPI Here for f/u of hypothyroidism  Other than tired doing well   Had a baby 5 weeks ago and is doing well  Hypothyroidism  Pt has no clinical changes No change in energy level/ hair or skin/ edema and no tremor Lab Results  Component Value Date   TSH 0.81 02/13/2012   feels like this is stable  Today- will check again   No growth in neck  No concerns healthwise    Flu vaccine-- had it during her pregnancy- oct    Did gain wt with pregnancy- is 183 lb currently  Lost 27 lb so far (gained 60) Working out    Will have 6 wk pap with gyn Tomorrow   On pnv and breast feeding infant    Patient Active Problem List  Diagnosis  . HUMAN PAPILLOMAVIRUS  . GOITER  . HYPOTHYROIDISM  . PURE HYPERCHOLESTEROLEMIA  . ABFND PAP SMEAR LGSIL  . Pre-employment examination   Past Medical History  Diagnosis Date  . Oral herpes   . Goiter     Hashimoto's thyroiditis  . HPV (human papilloma virus) anogenital infection     not high risk  . H/O varicella   . Hypothyroidism    Past Surgical History  Procedure Date  . Teratoma removal   . Cystectomy     dermoid cyst  . Oophorectomy 2001    left   History  Substance Use Topics  . Smoking status: Never Smoker   . Smokeless tobacco: Never Used  . Alcohol Use: No   Family History  Problem Relation Age of Onset  . Heart disease Maternal Grandmother     CAD  . Diabetes Maternal Grandfather   . Heart disease Paternal Grandfather     CAD  . Hypertension Paternal Grandfather   . Diabetes Paternal Grandfather   . Urolithiasis Sister   . Hypertension Mother   . Evelene Croon Parkinson White syndrome Mother   . Urolithiasis Mother   . Cancer Mother     breast also melanoma  . Hypertension Paternal Grandmother   . Diabetes Paternal Grandmother   . Heart disease Paternal Grandmother    No Known Allergies Current Outpatient  Prescriptions on File Prior to Visit  Medication Sig Dispense Refill  . Prenatal Vit-Fe Fumarate-FA (PRENATAL MULTIVITAMIN) TABS Take 1 tablet by mouth daily.      Marland Kitchen SYNTHROID 50 MCG tablet TAKE 1 TABLET (50 MCG TOTAL) BY MOUTH DAILY.  30 tablet  0     Review of Systems Review of Systems  Constitutional: Negative for fever, appetite change, fatigue and unexpected weight change.  Eyes: Negative for pain and visual disturbance.  Respiratory: Negative for cough and shortness of breath.   Cardiovascular: Negative for cp or palpitations    Gastrointestinal: Negative for nausea, diarrhea and constipation.  Genitourinary: Negative for urgency and frequency.  Skin: Negative for pallor or rash   Neurological: Negative for weakness, light-headedness, numbness and headaches.  Hematological: Negative for adenopathy. Does not bruise/bleed easily.  Psychiatric/Behavioral: Negative for dysphoric mood. The patient is not nervous/anxious.         Objective:   Physical Exam  Constitutional: She appears well-developed and well-nourished. No distress.       overwt and well app  HENT:  Head: Normocephalic and atraumatic.  Mouth/Throat: Oropharynx is clear and moist.  Eyes: Conjunctivae normal and EOM are normal.  Pupils are equal, round, and reactive to light. No scleral icterus.  Neck: Normal range of motion. Neck supple. No JVD present. Thyromegaly present.       Stable goiter No thyroid bruits  Cardiovascular: Normal rate, regular rhythm, normal heart sounds and intact distal pulses.  Exam reveals no gallop.   Pulmonary/Chest: Effort normal and breath sounds normal. No respiratory distress. She has no wheezes.  Abdominal: Soft. Bowel sounds are normal. She exhibits no distension and no mass. There is no tenderness.  Lymphadenopathy:    She has no cervical adenopathy.  Neurological: She is alert. She has normal reflexes. She displays no tremor.  Skin: Skin is warm and dry. No pallor.  Psychiatric:  She has a normal mood and affect.          Assessment & Plan:

## 2012-10-08 NOTE — Assessment & Plan Note (Signed)
Doing well 5 wk after delivering baby- loosing weight slowly tsh today and advise  No change in goiter/ exam

## 2012-10-09 ENCOUNTER — Encounter: Payer: Self-pay | Admitting: *Deleted

## 2012-10-09 ENCOUNTER — Other Ambulatory Visit: Payer: Self-pay | Admitting: Obstetrics and Gynecology

## 2013-03-07 ENCOUNTER — Ambulatory Visit (INDEPENDENT_AMBULATORY_CARE_PROVIDER_SITE_OTHER): Payer: BC Managed Care – PPO | Admitting: Family Medicine

## 2013-03-07 ENCOUNTER — Encounter: Payer: Self-pay | Admitting: Family Medicine

## 2013-03-07 VITALS — BP 100/68 | HR 77 | Temp 97.9°F | Wt 157.0 lb

## 2013-03-07 DIAGNOSIS — B309 Viral conjunctivitis, unspecified: Secondary | ICD-10-CM

## 2013-03-07 NOTE — Assessment & Plan Note (Signed)
See pt instructions. Use compresses and OTC lubricating drops. Universal precautions discussed as well. Reasons to update [place] for concern for bacterial conjunct discussed.

## 2013-03-07 NOTE — Patient Instructions (Signed)
Viral Conjunctivitis °Conjunctivitis is an irritation (inflammation) of the clear membrane that covers the white part of the eye (the conjunctiva). The irritation can also happen on the underside of the eyelids. Conjunctivitis makes the eye red or pink in color. This is what is commonly known as pink eye. Viral conjunctivitis can spread easily (contagious). °CAUSES  °· Infection from virus on the surface of the eye. °· Infection from the irritation or injury of nearby tissues such as the eyelids or cornea. °· More serious inflammation or infection on the inside of the eye. °· Other eye diseases. °· The use of certain eye medications. °SYMPTOMS  °The normally white color of the eye or the underside of the eyelid is usually pink or red in color. The pink eye is usually associated with irritation, tearing and some sensitivity to light. Viral conjunctivitis is often associated with a clear, watery discharge. If a discharge is present, there may also be some blurred vision in the affected eye. °DIAGNOSIS  °Conjunctivitis is diagnosed by an eye exam. The eye specialist looks for changes in the surface tissues of the eye which take on changes characteristic of the specific types of conjunctivitis. A sample of any discharge may be collected on a Q-Tip (sterile swap). The sample will be sent to a lab to see whether or not the inflammation is caused by bacterial or viral infection. °TREATMENT  °Viral conjunctivitis will not respond to medicines that kill germs (antibiotics). Treatment is aimed at stopping a bacterial infection on top of the viral infection. The goal of treatment is to relieve symptoms (such as itching) with antihistamine drops or other eye medications.  °HOME CARE INSTRUCTIONS  °· To ease discomfort, apply a cool, clean wash cloth to your eye for 10 to 20 minutes, 3 to 4 times a day. °· Gently wipe away any drainage from the eye with a warm, wet washcloth or a cotton ball. °· Wash your hands often with soap  and use paper towels to dry. °· Do not share towels or washcloths. This may spread the infection. °· Change or wash your pillowcase every day. °· You should not use eye make-up until the infection is gone. °· Stop using contacts lenses. Ask your eye professional how to sterilize or replace them before using again. This depends on the type of contact lenses used. °· Do not touch the edge of the eyelid with the eye drop bottle or ointment tube when applying medications to the affected eye. This will stop you from spreading the infection to the other eye or to others. °SEEK IMMEDIATE MEDICAL CARE IF:  °· The infection has not improved within 3 days of beginning treatment. °· A watery discharge from the eye develops. °· Pain in the eye increases. °· The redness is spreading. °· Vision becomes blurred. °· An oral temperature above 102° F (38.9° C) develops, or as your caregiver suggests. °· Facial pain, redness or swelling develops. °· Any problems that may be related to the prescribed medicine develop. °MAKE SURE YOU:  °· Understand these instructions. °· Will watch your condition. °· Will get help right away if you are not doing well or get worse. °Document Released: 09/19/2005 Document Revised: 12/12/2011 Document Reviewed: 05/08/2008 °ExitCare® Patient Information ©2014 [employer]. ° °

## 2013-03-07 NOTE — Progress Notes (Signed)
CC: pinkeye?  Noticed redness in R eye yesterday, worse last night.  This morning better.   Was painful, but no itching, drainage or crusting.  No vision changes.   Hasn't tried anything to treat this.   Normally wears contacts - hasn't worn since. No cold sxs. No recent illness. 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    6 mo child at home - in home daycare.  No allergy issues.  No h/o foreign body in eye that she knows of.  Past Medical History  Diagnosis Date  . Oral herpes   . Goiter     Hashimoto's thyroiditis  . HPV (human papilloma virus) anogenital infection     not high risk  . H/O varicella   . Hypothyroidism    PE: NAD, WDWN CF PERRLA, EOMI without pain.  Mild R bulbar conjunctival injection, limbic sparing. Fundi intact, clear optic disc margins Sinuses not tender MMM, oropharynx clear TMs clear with good light reflex, canals clear No AC LAD

## 2013-04-04 ENCOUNTER — Other Ambulatory Visit: Payer: Self-pay | Admitting: Dermatology

## 2013-11-06 ENCOUNTER — Other Ambulatory Visit: Payer: Self-pay | Admitting: Family Medicine

## 2014-01-07 ENCOUNTER — Other Ambulatory Visit: Payer: Self-pay | Admitting: Family Medicine

## 2014-01-20 ENCOUNTER — Other Ambulatory Visit: Payer: Self-pay | Admitting: *Deleted

## 2014-01-20 MED ORDER — SYNTHROID 50 MCG PO TABS: 50.0000 ug | ORAL_TABLET | Freq: Every day | ORAL | Status: DC

## 2014-07-13 ENCOUNTER — Other Ambulatory Visit: Payer: Self-pay | Admitting: Family Medicine

## 2014-07-14 NOTE — Telephone Encounter (Signed)
Electronic refill request, no recent/future appt., please advise  

## 2014-07-14 NOTE — Telephone Encounter (Signed)
Please schedule f/u and refill until then, thanks 

## 2014-07-15 NOTE — Telephone Encounter (Signed)
appt scheduled and med refilled 

## 2014-08-02 ENCOUNTER — Other Ambulatory Visit (HOSPITAL_COMMUNITY)
Admission: RE | Admit: 2014-08-02 | Discharge: 2014-08-02 | Disposition: A | Discharge status: Home / Self Care | Payer: BC Managed Care – PPO | Source: Ambulatory Visit | Attending: Emergency Medicine | Admitting: Emergency Medicine

## 2014-08-02 ENCOUNTER — Emergency Department (HOSPITAL_COMMUNITY)
Admission: EM | Admit: 2014-08-02 | Discharge: 2014-08-02 | Disposition: A | Discharge status: Home / Self Care | Payer: BC Managed Care – PPO | Source: Home / Self Care

## 2014-08-02 ENCOUNTER — Encounter (HOSPITAL_COMMUNITY): Payer: Self-pay | Admitting: Emergency Medicine

## 2014-08-02 DIAGNOSIS — B3731 Acute candidiasis of vulva and vagina: Secondary | ICD-10-CM

## 2014-08-02 DIAGNOSIS — B373 Candidiasis of vulva and vagina: Secondary | ICD-10-CM

## 2014-08-02 DIAGNOSIS — Z113 Encounter for screening for infections with a predominantly sexual mode of transmission: Secondary | ICD-10-CM | POA: Insufficient documentation

## 2014-08-02 DIAGNOSIS — N760 Acute vaginitis: Secondary | ICD-10-CM | POA: Insufficient documentation

## 2014-08-02 DIAGNOSIS — N898 Other specified noninflammatory disorders of vagina: Secondary | ICD-10-CM

## 2014-08-02 LAB — POCT URINALYSIS DIP (DEVICE)
BILIRUBIN URINE: NEGATIVE
GLUCOSE, UA: NEGATIVE mg/dL
KETONES UR: NEGATIVE mg/dL
Nitrite: NEGATIVE
Protein, ur: NEGATIVE mg/dL
Specific Gravity, Urine: 1.01 (ref 1.005–1.030)
Urobilinogen, UA: 0.2 mg/dL (ref 0.0–1.0)
pH: 5 (ref 5.0–8.0)

## 2014-08-02 LAB — POCT PREGNANCY, URINE: PREG TEST UR: NEGATIVE

## 2014-08-02 MED ORDER — FLUCONAZOLE 150 MG PO TABS: ORAL_TABLET | ORAL | Status: DC

## 2014-08-02 NOTE — Discharge Instructions (Signed)
Candidal Vulvovaginitis Candidal vulvovaginitis is an infection of the vagina and vulva. The vulva is the skin around the opening of the vagina. This may cause itching and discomfort in and around the vagina.  HOME CARE  Only take medicine as told by your doctor.  Do not have sex (intercourse) until the infection is healed or as told by your doctor.  Practice safe sex.  Tell your sex partner about your infection.  Do not douche or use tampons.  Wear cotton underwear. Do not wear tight pants or panty hose.  Eat yogurt. This may help treat and prevent yeast infections. GET HELP RIGHT AWAY IF:   You have a fever.  Your problems get worse during treatment or do not get better in 3 days.  You have discomfort, irritation, or itching in your vagina or vulva area.  You have pain after sex.  You start to get belly (abdominal) pain. MAKE SURE YOU:  Understand these instructions.  Will watch your condition.  Will get help right away if you are not doing well or get worse. Document Released: 12/16/2008 Document Revised: 09/24/2013 Document Reviewed: 12/16/2008 [facility] Patient Information 2015 [place]Care, [place]. This information is not intended to replace advice given to you by your health care provider. Make sure you discuss any questions you have with your health care provider.  Monilial Vaginitis Vaginitis in a soreness, swelling and redness (inflammation) of the vagina and vulva. Monilial vaginitis is not a sexually transmitted infection. CAUSES  Yeast vaginitis is caused by yeast (candida) that is normally found in your vagina. With a yeast infection, the candida has overgrown in number to a point that upsets the chemical balance. SYMPTOMS   White, thick vaginal discharge.  Swelling, itching, redness and irritation of the vagina and possibly the lips of the vagina (vulva).  Burning or painful urination.  Painful intercourse. DIAGNOSIS  Things that may contribute to monilial  vaginitis are:  Postmenopausal and virginal states.  Pregnancy.  Infections.  Being tired, sick or stressed, especially if you had monilial vaginitis in the past.  Diabetes. Good control will help lower the chance.  Birth control pills.  Tight fitting garments.  Using bubble bath, feminine sprays, douches or deodorant tampons.  Taking certain medications that kill germs (antibiotics).  Sporadic recurrence can occur if you become ill. TREATMENT  Your caregiver will give you medication.  There are several kinds of anti monilial vaginal creams and suppositories specific for monilial vaginitis. For recurrent yeast infections, use a suppository or cream in the vagina 2 times a week, or as directed.  Anti-monilial or steroid cream for the itching or irritation of the vulva may also be used. Get your caregiver's permission.  Painting the vagina with methylene blue solution may help if the monilial cream does not work.  Eating yogurt may help prevent monilial vaginitis. HOME CARE INSTRUCTIONS   Finish all medication as prescribed.  Do not have sex until treatment is completed or after your caregiver tells you it is okay.  Take warm sitz baths.  Do not douche.  Do not use tampons, especially scented ones.  Wear cotton underwear.  Avoid tight pants and panty hose.  Tell your sexual partner that you have a yeast infection. They should go to their caregiver if they have symptoms such as mild rash or itching.  Your sexual partner should be treated as well if your infection is difficult to eliminate.  Practice safer sex. Use condoms.  Some vaginal medications cause latex condoms to  fail. Vaginal medications that harm condoms are:  Cleocin cream.  Butoconazole (Femstat).  Terconazole (Terazol) vaginal suppository.  Miconazole (Monistat) (may be purchased over the counter). SEEK MEDICAL CARE IF:   You have a temperature by mouth above 102 F (38.9 C).  The  infection is getting worse after 2 days of treatment.  The infection is not getting better after 3 days of treatment.  You develop blisters in or around your vagina.  You develop vaginal bleeding, and it is not your menstrual period.  You have pain when you urinate.  You develop intestinal problems.  You have pain with sexual intercourse. Document Released: 06/29/2005 Document Revised: 12/12/2011 Document Reviewed: 03/13/2009 [facility] Patient Information 2015 [place]ExitCare, [place]LLC. This information is not intended to replace advice given to you by your health care provider. Make sure you discuss any questions you have with your health care provider.

## 2014-08-02 NOTE — ED Notes (Signed)
Pt states that she has had vaginal itching and irritation for 2 days she states that she woke up this morning and noticed swelling. Pt denies any pain at this time. Pt is in no acute distress.

## 2014-08-02 NOTE — ED Provider Notes (Signed)
CSN: [id number]     Arrival date & time 08/02/14  1015 History   First MD Initiated Contact with Patient 08/02/14 1130     Chief Complaint  Patient presents with  . Groin Swelling  . Vaginal Itching   (Consider location/radiation/quality/duration/timing/severity/associated sxs/prior Treatment) HPI Comments: 23 year old female complaining of vaginal discomfort for 2 days. Yesterday she noticed full work ridges and swelling. This is accompanied by a small amount of white cottage cheese type discharge. Denies urinary symptoms. LMP approximately 5 days ago, flow ended yesterday.   Past Medical History  Diagnosis Date  . Oral herpes   . Goiter     Hashimoto's thyroiditis  . HPV (human papilloma virus) anogenital infection     not high risk  . H/O varicella   . Hypothyroidism    Past Surgical History  Procedure Laterality Date  . Teratoma removal    . Cystectomy      dermoid cyst  . Oophorectomy  2001    left   Family History  Problem Relation Age of Onset  . Heart disease Maternal Grandmother     CAD  . Diabetes Maternal Grandfather   . Heart disease Paternal Grandfather     CAD  . Hypertension Paternal Grandfather   . Diabetes Paternal Grandfather   . Urolithiasis Sister   . Hypertension Mother   . Evelene CroonWolff Parkinson White syndrome Mother   . Urolithiasis Mother   . Cancer Mother     breast also melanoma  . Hypertension Paternal Grandmother   . Diabetes Paternal Grandmother   . Heart disease Paternal Grandmother    History  Substance Use Topics  . Smoking status: Never Smoker   . Smokeless tobacco: Never Used  . Alcohol Use: No   OB History   Grav Para Term Preterm Abortions TAB SAB Ect Mult Living   1 1 1  0 0 0 0 0 0 1     Review of Systems  Constitutional: Negative.   Respiratory: Negative.   Gastrointestinal: Negative.   Genitourinary: Positive for vaginal discharge and vaginal pain. Negative for dysuria, frequency, hematuria, flank pain, vaginal  bleeding, menstrual problem and pelvic pain.  All other systems reviewed and are negative.   Allergies  Review of patient's allergies indicates no known allergies.  Home Medications   Prior to Admission medications   Medication Sig Start Date End Date Taking? Authorizing Provider  SYNTHROID 50 MCG tablet TAKE 1 TABLET (50 MCG TOTAL) BY MOUTH DAILY. 07/15/14  Yes Judy PimpleMarne A Tower, MD  fluconazole (DIFLUCAN) 150 MG tablet 1 tab po q o d x 3 doses 08/02/14   Hayden Rasmussenavid Kanai Berrios, NP   BP 127/79  Pulse 76  Temp(Src) 98.3 F (36.8 C) (Oral)  Resp 20  SpO2 100%  LMP 07/28/2014  Breastfeeding? Unknown Physical Exam  Nursing note and vitals reviewed. Constitutional: She is oriented to person, place, and time. She appears well-developed and well-nourished. No distress.  Eyes: EOM are normal.  Neck: Normal range of motion. Neck supple.  Cardiovascular: Normal rate.   Pulmonary/Chest: Effort normal. No respiratory distress.  Abdominal: Soft. She exhibits no distension and no mass. There is no tenderness. There is no rebound and no guarding.  Genitourinary:  Anatomically normal external female genitalia. There is gross erythema with swelling of the left labia minora and majora. There is a coating of powdered type discharge in the external mucosa. Small amount intravaginal discharge that quite cottage cheese type consistency. Cervix midline and posterior. Active cervix mildly erythematous. No  CMT or adnexal tenderness.  Musculoskeletal: Normal range of motion. She exhibits no edema.  Neurological: She is alert and oriented to person, place, and time. She exhibits normal muscle tone.  Skin: Skin is warm and dry.  Psychiatric: She has a normal mood and affect.    ED Course  Procedures (including critical care time) Labs Review Labs Reviewed  POCT URINALYSIS DIP (DEVICE) - Abnormal; Notable for the following:    Hgb urine dipstick SMALL (*)    Leukocytes, UA LARGE (*)    All other components  within normal limits  POCT PREGNANCY, URINE   Results for orders placed during the hospital encounter of 08/02/14  POCT URINALYSIS DIP (DEVICE)      Result Value Ref Range   Glucose, UA NEGATIVE  NEGATIVE mg/dL   Bilirubin Urine NEGATIVE  NEGATIVE   Ketones, ur NEGATIVE  NEGATIVE mg/dL   Specific Gravity, Urine 1.010  1.005 - 1.030   Hgb urine dipstick SMALL (*) NEGATIVE   pH 5.0  5.0 - 8.0   Protein, ur NEGATIVE  NEGATIVE mg/dL   Urobilinogen, UA 0.2  0.0 - 1.0 mg/dL   Nitrite NEGATIVE  NEGATIVE   Leukocytes, UA LARGE (*) NEGATIVE  POCT PREGNANCY, URINE      Result Value Ref Range   Preg Test, Ur NEGATIVE  NEGATIVE    Imaging Review No results found.   MDM   1. Candida vaginitis   2. Vaginal discharge    Diflucan 150 q o d x 3 doses Topical monistat for 3-4 d cerv cytology pending    Hayden RasmussenDavid Emmerie Battaglia, NP 08/02/14 1154

## 2014-08-02 NOTE — ED Provider Notes (Signed)
Medical screening examination/treatment/procedure(s) were performed by resident physician or non-physician practitioner and as supervising physician I was immediately available for consultation/collaboration.   Graviela Nodal DOUGLAS MD.   Jakyla Reza D Melik Blancett, MD 08/02/14 1344 

## 2014-08-04 ENCOUNTER — Encounter (HOSPITAL_COMMUNITY): Payer: Self-pay | Admitting: Emergency Medicine

## 2014-08-04 LAB — CERVICOVAGINAL ANCILLARY ONLY
CHLAMYDIA, DNA PROBE: NEGATIVE
NEISSERIA GONORRHEA: NEGATIVE

## 2014-08-05 LAB — CERVICOVAGINAL ANCILLARY ONLY
WET PREP (BD AFFIRM): NEGATIVE
WET PREP (BD AFFIRM): NEGATIVE
WET PREP (BD AFFIRM): NEGATIVE

## 2014-08-06 ENCOUNTER — Encounter: Payer: Self-pay | Admitting: Family Medicine

## 2014-08-06 ENCOUNTER — Ambulatory Visit (INDEPENDENT_AMBULATORY_CARE_PROVIDER_SITE_OTHER): Payer: BC Managed Care – PPO | Admitting: Family Medicine

## 2014-08-06 VITALS — BP 90/60 | HR 65 | Temp 98.2°F | Ht 66.0 in | Wt 151.5 lb

## 2014-08-06 DIAGNOSIS — E039 Hypothyroidism, unspecified: Secondary | ICD-10-CM

## 2014-08-06 DIAGNOSIS — B3731 Acute candidiasis of vulva and vagina: Secondary | ICD-10-CM | POA: Insufficient documentation

## 2014-08-06 DIAGNOSIS — B373 Candidiasis of vulva and vagina: Secondary | ICD-10-CM

## 2014-08-06 DIAGNOSIS — E049 Nontoxic goiter, unspecified: Secondary | ICD-10-CM

## 2014-08-06 DIAGNOSIS — Z23 Encounter for immunization: Secondary | ICD-10-CM

## 2014-08-06 MED ORDER — SYNTHROID 50 MCG PO TABS: ORAL_TABLET | ORAL | Status: DC

## 2014-08-06 MED ORDER — TERCONAZOLE 0.8 % VA CREA: TOPICAL_CREAM | VAGINAL | Status: DC

## 2014-08-06 NOTE — Progress Notes (Signed)
Pre visit review using our clinic review tool, if applicable. No additional management support is needed unless otherwise documented below in the visit note. 

## 2014-08-06 NOTE — Progress Notes (Signed)
Subjective:    Patient ID: Robin Pollard, female    DOB: December 27, 1990, 23 y.o.   MRN: [id number]  HPI  Here to follow up for her thyroid and also had question re: recent yeast vaginitis  Doing well    No change in symptoms Lab Results  Component Value Date   TSH 0.50 10/08/2012   no missed doses of synthroid  No skin or hair changes  Energy level is fair  Has a goiter - last saw endocrinologist in [place]   Has not changed in size and no problems swallowing   She was tx for yeast vaginitis by urgent care -given 3 diflucan and is improving but still itching No longer has d/c  Patient Active Problem List   Diagnosis Date Noted  . Yeast vaginitis 08/06/2014  . Pre-employment examination 04/11/2011  . PURE HYPERCHOLESTEROLEMIA 05/13/2010  . Hypothyroidism 11/13/2009  . Goiter 05/12/2008  . HUMAN PAPILLOMAVIRUS 05/11/2007  . ABFND PAP SMEAR LGSIL 05/11/2007   Past Medical History  Diagnosis Date  . Oral herpes   . Goiter     Hashimoto's thyroiditis  . HPV (human papilloma virus) anogenital infection     not high risk  . H/O varicella   . Hypothyroidism    Past Surgical History  Procedure Laterality Date  . Teratoma removal    . Cystectomy      dermoid cyst  . Oophorectomy  2001    left   History  Substance Use Topics  . Smoking status: Never Smoker   . Smokeless tobacco: Never Used  . Alcohol Use: No   Family History  Problem Relation Age of Onset  . Heart disease Maternal Grandmother     CAD  . Diabetes Maternal Grandfather   . Heart disease Paternal Grandfather     CAD  . Hypertension Paternal Grandfather   . Diabetes Paternal Grandfather   . Urolithiasis Sister   . Hypertension Mother   . Evelene CroonWolff Parkinson White syndrome Mother   . Urolithiasis Mother   . Cancer Mother     breast also melanoma  . Hypertension Paternal Grandmother   . Diabetes Paternal Grandmother   . Heart disease Paternal Grandmother    No Known Allergies No current outpatient  prescriptions on file prior to visit.   No current facility-administered medications on file prior to visit.     Review of Systems Review of Systems  Constitutional: Negative for fever, appetite change, fatigue and unexpected weight change.  Eyes: Negative for pain and visual disturbance.  Respiratory: Negative for cough and shortness of breath.   Cardiovascular: Negative for cp or palpitations    Gastrointestinal: Negative for nausea, diarrhea and constipation.  Genitourinary: Negative for urgency and frequency.  Skin: Negative for pallor or rash  pos for vaginal itching  Neurological: Negative for weakness, light-headedness, numbness and headaches.  Hematological: Negative for adenopathy. Does not bruise/bleed easily.  Psychiatric/Behavioral: Negative for dysphoric mood. The patient is not nervous/anxious.         Objective:   Physical Exam  Constitutional: She appears well-developed and well-nourished. No distress.  HENT:  Head: Normocephalic and atraumatic.  Mouth/Throat: Oropharynx is clear and moist.  Eyes: Conjunctivae and EOM are normal. Pupils are equal, round, and reactive to light. No scleral icterus.  Neck: Normal range of motion. Neck supple. No JVD present. Thyromegaly present.  Goiter is symmetric and nt  Cardiovascular: Normal rate, regular rhythm and normal heart sounds.   Pulmonary/Chest: Effort normal and breath sounds normal.  No respiratory distress. She has no wheezes. She has no rales.  Musculoskeletal: She exhibits no edema.  Lymphadenopathy:    She has no cervical adenopathy.  Neurological: She is alert. She has normal reflexes. She displays no tremor. She exhibits normal muscle tone. Coordination normal.  Skin: Skin is warm and dry. No rash noted. No pallor.  Psychiatric: She has a normal mood and affect.          Assessment & Plan:   Problem List Items Addressed This Visit      Endocrine   Goiter    Doing well, no changes Did send for last  endocrinology note      Relevant Medications      SYNTHROID 50 MCG tablet   Hypothyroidism - Primary    tsh today  No clinical or exam changes  Levothyroxine refilled     Relevant Medications      SYNTHROID 50 MCG tablet   Other Relevant Orders      TSH (Completed)     Genitourinary   Yeast vaginitis    Pt was tx by urgent care- improved with 2 doses of diflucan but still has itching  Px terazol 3 cream to use on affected area  Will update if no further imp after this      Other Visit Diagnoses    Need for prophylactic vaccination and inoculation against influenza        Relevant Orders       Flu Vaccine QUAD 36+ mos IM (Completed)

## 2014-08-06 NOTE — Patient Instructions (Signed)
Thyroid lab today  Hold px for thyroid medicine until you hear that we do not need to change the dose  Flu vaccine today  Try the terazol cream for itching from yeast  Give it 4 more days and if no further improvement please let me know

## 2014-08-07 LAB — TSH: TSH: 0.83 u[IU]/mL (ref 0.35–4.50)

## 2014-08-07 NOTE — Assessment & Plan Note (Signed)
Doing well, no changes Did send for last endocrinology note

## 2014-08-07 NOTE — Assessment & Plan Note (Signed)
tsh today  No clinical or exam changes  Levothyroxine refilled

## 2014-08-07 NOTE — Assessment & Plan Note (Signed)
Pt was tx by urgent care- improved with 2 doses of diflucan but still has itching  Px terazol 3 cream to use on affected area  Will update if no further imp after this

## 2014-10-15 ENCOUNTER — Encounter: Payer: Self-pay | Admitting: Family Medicine

## 2014-10-15 ENCOUNTER — Ambulatory Visit (INDEPENDENT_AMBULATORY_CARE_PROVIDER_SITE_OTHER): Payer: BC Managed Care – PPO | Admitting: Family Medicine

## 2014-10-15 VITALS — BP 98/58 | HR 80 | Temp 98.7°F | Ht 66.0 in | Wt 157.2 lb

## 2014-10-15 DIAGNOSIS — J320 Chronic maxillary sinusitis: Secondary | ICD-10-CM

## 2014-10-15 MED ORDER — AMOXICILLIN-POT CLAVULANATE 875-125 MG PO TABS: 1.0000 | ORAL_TABLET | Freq: Two times a day (BID) | ORAL | Status: DC

## 2014-10-15 NOTE — Progress Notes (Signed)
Pre visit review using our clinic review tool, if applicable. No additional management support is needed unless otherwise documented below in the visit note. 

## 2014-10-15 NOTE — Progress Notes (Signed)
   Dr. Karleen HampshireSpencer T. Shaheim Mahar, MD, CAQ Sports [facility] [address] [place], [address] Phone: [phone] Fax: [phone]  10/15/2014  Patient: Robin BlaseJena G Roberts, MRN: [id number], DOB: 07/21/1991, 24 y.o.  Primary Physician:  Roxy MannsMarne Tower, MD  Chief Complaint: Sinusitis  Subjective:   This 24 y.o. female patient presents with runny nose, sneezing, cough, sore throat, malaise and minimal / low-grade fever for > 1 week. Now the primary complaint has become sinus pressure and pain behind the eyes and in the upper, anterior face.   Had about a 4 week long cold. No fevers. 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   The patent denies sore throat as the primary complaint. Denies sthortness of breath/wheezing, high fever, chest pain, significant myalgia, otalgia, abdominal pain, changes in bowel or bladder.  PMH, PHS, Allergies, Problem List, Medications, Family History, and Social History have all been reviewed.  ROS as above, eating and drinking - tolerating PO. Urinating normally. No excessive vomitting or diarrhea. O/w as above.  Objective:   Blood pressure 98/58, pulse 80, temperature 98.7 F (37.1 C), temperature source Oral, height 5\' 6"  (1.676 m), weight 157 lb 4 oz (71.328 kg), last menstrual period 09/22/2014, SpO2 98 %, not currently breastfeeding.  GEN: WDWN, Non-toxic, Atraumatic, normocephalic. A and O x 3. HEENT: Oropharynx clear without exudate, MMM, no significant LAD, mild rhinnorhea Sinuses: Right Frontal, ethmoid, and maxillary: Tender Left Frontal, Ethmoid, and maxillary: Tender Ears: TM clear, COL visualized with good landmarks CV: RRR, no m/g/r. Pulm: CTA B, no wheezes, rhonchi, or crackles, normal respiratory effort. EXT: no c/c/e Psych: well oriented, neither depressed nor anxious in appearance  Assessment and Plan:   Right maxillary sinusitis  Acute sinusitis: ABX as below.   Reviewed symptomatic care as well as ABX in this  case.    Follow-up: No Follow-up on file.  New Prescriptions   AMOXICILLIN-CLAVULANATE (AUGMENTIN) 875-125 MG PER TABLET    Take 1 tablet by mouth 2 (two) times daily.   No orders of the defined types were placed in this encounter.    Signed,  Elpidio GaleaSpencer T. Akiyah Eppolito, MD  Patient's Medications  New Prescriptions   AMOXICILLIN-CLAVULANATE (AUGMENTIN) 875-125 MG PER TABLET    Take 1 tablet by mouth 2 (two) times daily.  Previous Medications   SYNTHROID 50 MCG TABLET    TAKE 1 TABLET (50 MCG TOTAL) BY MOUTH DAILY.  Modified Medications   No medications on file  Discontinued Medications   TERCONAZOLE (TERAZOL 3) 0.8 % VAGINAL CREAM    Apply to affected areas once daily as needed for yeast infection

## 2015-02-10 ENCOUNTER — Other Ambulatory Visit: Payer: Self-pay | Admitting: Obstetrics and Gynecology

## 2015-02-11 ENCOUNTER — Encounter (HOSPITAL_COMMUNITY): Payer: Self-pay | Admitting: *Deleted

## 2015-02-11 LAB — CYTOLOGY - PAP

## 2015-02-12 ENCOUNTER — Ambulatory Visit (HOSPITAL_COMMUNITY): Payer: BC Managed Care – PPO | Admitting: Anesthesiology

## 2015-02-12 ENCOUNTER — Ambulatory Visit (HOSPITAL_COMMUNITY): Payer: BC Managed Care – PPO

## 2015-02-12 ENCOUNTER — Ambulatory Visit (HOSPITAL_COMMUNITY)
Admission: RE | Admit: 2015-02-12 | Discharge: 2015-02-12 | Disposition: A | Discharge status: Home / Self Care | Payer: BC Managed Care – PPO | Source: Ambulatory Visit | Attending: Obstetrics and Gynecology | Admitting: Obstetrics and Gynecology

## 2015-02-12 ENCOUNTER — Encounter (HOSPITAL_COMMUNITY): Payer: Self-pay | Admitting: Emergency Medicine

## 2015-02-12 ENCOUNTER — Encounter (HOSPITAL_COMMUNITY)
Admission: RE | Disposition: A | Discharge status: Home / Self Care | Payer: Self-pay | Source: Ambulatory Visit | Attending: Obstetrics and Gynecology

## 2015-02-12 DIAGNOSIS — E780 Pure hypercholesterolemia: Secondary | ICD-10-CM | POA: Diagnosis not present

## 2015-02-12 DIAGNOSIS — E063 Autoimmune thyroiditis: Secondary | ICD-10-CM | POA: Insufficient documentation

## 2015-02-12 DIAGNOSIS — E039 Hypothyroidism, unspecified: Secondary | ICD-10-CM | POA: Diagnosis not present

## 2015-02-12 DIAGNOSIS — O021 Missed abortion: Secondary | ICD-10-CM

## 2015-02-12 HISTORY — PX: DILATION AND EVACUATION: SHX1459

## 2015-02-12 LAB — CBC
HCT: 38.6 % (ref 36.0–46.0)
HEMOGLOBIN: 13.3 g/dL (ref 12.0–15.0)
MCH: 30.1 pg (ref 26.0–34.0)
MCHC: 34.5 g/dL (ref 30.0–36.0)
MCV: 87.3 fL (ref 78.0–100.0)
Platelets: 201 10*3/uL (ref 150–400)
RBC: 4.42 MIL/uL (ref 3.87–5.11)
RDW: 12.9 % (ref 11.5–15.5)
WBC: 7.7 10*3/uL (ref 4.0–10.5)

## 2015-02-12 SURGERY — DILATION AND EVACUATION, UTERUS
Anesthesia: Monitor Anesthesia Care | Site: Uterus

## 2015-02-12 MED ORDER — LACTATED RINGERS IV SOLN
INTRAVENOUS | Status: DC
Start: 2015-02-12 — End: 2015-02-12
  Administered 2015-02-12 (×2): via INTRAVENOUS

## 2015-02-12 MED ORDER — LIDOCAINE HCL 1 % IJ SOLN
INTRAMUSCULAR | Status: AC
  Filled 2015-02-12: qty 20

## 2015-02-12 MED ORDER — CEFAZOLIN SODIUM-DEXTROSE 2-3 GM-% IV SOLR
INTRAVENOUS | Status: AC
  Filled 2015-02-12: qty 50

## 2015-02-12 MED ORDER — METHYLERGONOVINE MALEATE 0.2 MG PO TABS
0.2000 mg | ORAL_TABLET | Freq: Four times a day (QID) | ORAL | Status: DC
Start: 2015-02-12 — End: 2015-08-14

## 2015-02-12 MED ORDER — ACETAMINOPHEN 160 MG/5ML PO SOLN
975.0000 mg | Freq: Once | ORAL | Status: AC
  Administered 2015-02-12: 975 mg via ORAL

## 2015-02-12 MED ORDER — ONDANSETRON HCL 4 MG/2ML IJ SOLN
INTRAMUSCULAR | Status: DC | PRN
  Administered 2015-02-12: 4 mg via INTRAVENOUS

## 2015-02-12 MED ORDER — EPHEDRINE SULFATE 50 MG/ML IJ SOLN
INTRAMUSCULAR | Status: DC | PRN
  Administered 2015-02-12: 10 mg via INTRAVENOUS

## 2015-02-12 MED ORDER — ACETAMINOPHEN 160 MG/5ML PO SOLN
ORAL | Status: AC
  Filled 2015-02-12: qty 40.6

## 2015-02-12 MED ORDER — LIDOCAINE HCL (CARDIAC) 20 MG/ML IV SOLN
INTRAVENOUS | Status: AC
  Filled 2015-02-12: qty 5

## 2015-02-12 MED ORDER — MIDAZOLAM HCL 2 MG/2ML IJ SOLN
INTRAMUSCULAR | Status: AC
  Filled 2015-02-12: qty 2

## 2015-02-12 MED ORDER — SCOPOLAMINE 1 MG/3DAYS TD PT72
MEDICATED_PATCH | TRANSDERMAL | Status: AC
  Filled 2015-02-12: qty 1

## 2015-02-12 MED ORDER — SCOPOLAMINE 1 MG/3DAYS TD PT72
1.0000 | MEDICATED_PATCH | Freq: Once | TRANSDERMAL | Status: DC
  Administered 2015-02-12: 1.5 mg via TRANSDERMAL

## 2015-02-12 MED ORDER — METHYLERGONOVINE MALEATE 0.2 MG/ML IJ SOLN
INTRAMUSCULAR | Status: DC | PRN
  Administered 2015-02-12: 0.2 mg via INTRAMUSCULAR

## 2015-02-12 MED ORDER — SCOPOLAMINE 1 MG/3DAYS TD PT72
MEDICATED_PATCH | TRANSDERMAL | Status: AC
  Administered 2015-02-12: 1.5 mg via TRANSDERMAL
  Filled 2015-02-12: qty 1

## 2015-02-12 MED ORDER — PROPOFOL 10 MG/ML IV BOLUS
INTRAVENOUS | Status: DC | PRN
  Administered 2015-02-12: 200 mg via INTRAVENOUS

## 2015-02-12 MED ORDER — PROPOFOL 10 MG/ML IV BOLUS
INTRAVENOUS | Status: AC
  Filled 2015-02-12: qty 20

## 2015-02-12 MED ORDER — FENTANYL CITRATE (PF) 100 MCG/2ML IJ SOLN
INTRAMUSCULAR | Status: AC
  Filled 2015-02-12: qty 2

## 2015-02-12 MED ORDER — CEFAZOLIN SODIUM-DEXTROSE 2-3 GM-% IV SOLR
2.0000 g | INTRAVENOUS | Status: AC
  Administered 2015-02-12: 2 g via INTRAVENOUS

## 2015-02-12 MED ORDER — LIDOCAINE HCL (CARDIAC) 20 MG/ML IV SOLN
INTRAVENOUS | Status: DC | PRN
  Administered 2015-02-12: 100 mg via INTRAVENOUS

## 2015-02-12 MED ORDER — DEXAMETHASONE SODIUM PHOSPHATE 10 MG/ML IJ SOLN
INTRAMUSCULAR | Status: DC | PRN
  Administered 2015-02-12: 4 mg via INTRAVENOUS

## 2015-02-12 MED ORDER — MIDAZOLAM HCL 2 MG/2ML IJ SOLN
INTRAMUSCULAR | Status: DC | PRN
Start: 2015-02-12 — End: 2015-02-12
  Administered 2015-02-12: 2 mg via INTRAVENOUS

## 2015-02-12 MED ORDER — FENTANYL CITRATE (PF) 100 MCG/2ML IJ SOLN: 25.0000 ug | INTRAMUSCULAR | Status: DC | PRN

## 2015-02-12 SURGICAL SUPPLY — 18 items
CATH ROBINSON RED A/P 16FR (CATHETERS) ×3 IMPLANT
CLOTH BEACON ORANGE TIMEOUT ST (SAFETY) ×3 IMPLANT
DECANTER SPIKE VIAL GLASS SM (MISCELLANEOUS) IMPLANT
GLOVE BIO SURGEON STRL SZ 6.5 (GLOVE) ×4 IMPLANT
GLOVE BIO SURGEONS STRL SZ 6.5 (GLOVE) ×2
GLOVE SURG SS PI 7.0 STRL IVOR (GLOVE) ×9 IMPLANT
GOWN STRL REUS W/TWL LRG LVL3 (GOWN DISPOSABLE) ×6 IMPLANT
KIT BERKELEY 1ST TRIMESTER 3/8 (MISCELLANEOUS) ×3 IMPLANT
NS IRRIG 1000ML POUR BTL (IV SOLUTION) ×3 IMPLANT
PACK VAGINAL MINOR WOMEN LF (CUSTOM PROCEDURE TRAY) ×3 IMPLANT
PAD OB MATERNITY 4.3X12.25 (PERSONAL CARE ITEMS) ×3 IMPLANT
PAD PREP 24X48 CUFFED NSTRL (MISCELLANEOUS) ×3 IMPLANT
SET BERKELEY SUCTION TUBING (SUCTIONS) ×3 IMPLANT
TOWEL OR 17X24 6PK STRL BLUE (TOWEL DISPOSABLE) ×6 IMPLANT
VACURETTE 10 RIGID CVD (CANNULA) IMPLANT
VACURETTE 7MM CVD STRL WRAP (CANNULA) IMPLANT
VACURETTE 8 RIGID CVD (CANNULA) ×3 IMPLANT
VACURETTE 9 RIGID CVD (CANNULA) IMPLANT

## 2015-02-12 NOTE — Transfer of Care (Signed)
Immediate Anesthesia Transfer of Care Note  Patient: Robin Pollard  Procedure(s) Performed: Procedure(s): DILATATION AND EVACUATION WITH ULTRASOUND GUIDANCE (N/A)  Patient Location: PACU  Anesthesia Type:General  Level of Consciousness: awake, alert  and oriented  Airway & Oxygen Therapy: Patient Spontanous Breathing and Patient connected to nasal cannula oxygen  Post-op Assessment: Report given to RN and Post -op Vital signs reviewed and stable  Post vital signs: Reviewed and stable  Last Vitals:  Filed Vitals:   02/12/15 1222  BP: 124/75  Temp: 36.8 C  Resp: 18    Complications: No apparent anesthesia complications

## 2015-02-12 NOTE — Brief Op Note (Signed)
02/12/2015  2:07 PM  PATIENT:  Robin Pollard  24 y.o. female  PRE-OPERATIVE DIAGNOSIS:  MISSED AB, 1ST TRIMESTER  POST-OPERATIVE DIAGNOSIS:  MISSED Abortion, 1ST TRIMESTER  PROCEDURE:  Procedure(s): DILATATION AND EVACUATION WITH ULTRASOUND GUIDANCE (N/A)  SURGEON:  Surgeon(s) and Role:    * Marcelle OverlieMichelle Kam Kushnir, MD - Primary  PHYSICIAN ASSISTANT:   ASSISTANTS: none   ANESTHESIA:   LMA  EBL:  Total I/O In: -  Out: 150 [Urine:50; Blood:100]  BLOOD ADMINISTERED:none  DRAINS: none   LOCAL MEDICATIONS USED:  LIDOCAINE   SPECIMEN:  Source of Specimen:  POC  DISPOSITION OF SPECIMEN:  PATHOLOGY  COUNTS:  YES  TOURNIQUET:  * No tourniquets in log *  DICTATION: .Other Dictation: Dictation Number [id number]  PLAN OF CARE: Discharge to home after PACU  PATIENT DISPOSITION:  PACU - hemodynamically stable.   Delay start of Pharmacological VTE agent (>24hrs) due to surgical blood loss or risk of bleeding: not applicable

## 2015-02-12 NOTE — Progress Notes (Signed)
H and P on the chart No significant changes Will proceed with D and E for missed abortion

## 2015-02-12 NOTE — H&P (Signed)
  24 year old female with missed ab at 9 weeks.  Past Medical History  Diagnosis Date  . Oral herpes   . Goiter     Hashimoto's thyroiditis  . HPV (human papilloma virus) anogenital infection     not high risk  . H/O varicella   . Hypothyroidism   . SVD (spontaneous vaginal delivery)     X 1   Past Surgical History  Procedure Laterality Date  . Teratoma removal    . Cystectomy      dermoid cyst  . Oophorectomy  2001    left  . Wisdom tooth extraction     Prior to Admission medications   Medication Sig Start Date End Date Taking? Authorizing Provider  Doxylamine-Pyridoxine 10-10 MG TBEC Take 2 tablets by mouth at bedtime.   Yes Historical Provider, MD  Prenatal Vit-Fe Fumarate-FA (PRENATAL MULTIVITAMIN) TABS tablet Take 1 tablet by mouth daily at 12 noon.   Yes Historical Provider, MD  amoxicillin-clavulanate (AUGMENTIN) 875-125 MG per tablet Take 1 tablet by mouth 2 (two) times daily. Patient not taking: Reported on 02/10/2015 10/15/14   Hannah BeatSpencer Copland, MD  SYNTHROID 50 MCG tablet TAKE 1 TABLET (50 MCG TOTAL) BY MOUTH DAILY. Patient taking differently: Take 50 mcg by mouth daily before breakfast.  08/06/14   Judy PimpleMarne A Tower, MD   .famx History   Social History  . Marital Status: Single    Spouse Name: N/A  . Number of Children: N/A  . Years of Education: N/A   Occupational History  . Not on file.   Social History Main Topics  . Smoking status: Never Smoker   . Smokeless tobacco: Never Used  . Alcohol Use: No  . Drug Use: No  . Sexual Activity: Yes    Birth Control/ Protection: None     Comment: 11.[redacted] weeks gestation apx   Other Topics Concern  . Not on file   Social History Narrative   Allergies NKDA Ht 5\' 7"  (1.702 m)  Wt 77.111 kg (170 lb)  BMI 26.62 kg/m2  LMP 09/22/2014 No results found for this or any previous visit (from the past 24 hour(s)). General alert and oriented Lung CTAB Car RRR Pelvic is normal except for above  IMPRESSION: Missed  Abortion  PLAN: D and E with ultrasound guidance  Consent signed

## 2015-02-12 NOTE — Anesthesia Procedure Notes (Signed)
Procedure Name: LMA Insertion Date/Time: 02/12/2015 12:38 PM Performed by: Takuya Lariccia, Jannet AskewHARLESETTA Pollard Pre-anesthesia Checklist: Patient identified, Timeout performed, Emergency Drugs available, Suction available and Patient being monitored Patient Re-evaluated:Patient Re-evaluated prior to inductionOxygen Delivery Method: Circle system utilized Preoxygenation: Pre-oxygenation with 100% oxygen Intubation Type: Inhalational induction with existing ETT LMA: LMA inserted LMA Size: 4.0 Number of attempts: 1 Placement Confirmation: positive ETCO2 ETT to lip (cm): at lips. Dental Injury: Teeth and Oropharynx as per pre-operative assessment

## 2015-02-12 NOTE — Anesthesia Postprocedure Evaluation (Signed)
  Anesthesia Post-op Note  Patient: Robin Pollard  Procedure(s) Performed: Procedure(s) (LRB): DILATATION AND EVACUATION WITH ULTRASOUND GUIDANCE (N/A)  Patient Location: PACU  Anesthesia Type: General  Level of Consciousness: awake and alert   Airway and Oxygen Therapy: Patient Spontanous Breathing  Post-op Pain: mild  Post-op Assessment: Post-op Vital signs reviewed, Patient's Cardiovascular Status Stable, Respiratory Function Stable, Patent Airway and No signs of Nausea or vomiting  Last Vitals:  Filed Vitals:   02/12/15 1430  BP: 110/65  Pulse: 85  Temp:   Resp: 19    Post-op Vital Signs: stable   Complications: No apparent anesthesia complications

## 2015-02-12 NOTE — Discharge Instructions (Signed)

## 2015-02-12 NOTE — Anesthesia Preprocedure Evaluation (Addendum)
Anesthesia Evaluation  Patient identified by MRN, date of birth, ID band Patient awake    Reviewed: Allergy & Precautions, H&P , Patient's Chart, lab work & pertinent test results, reviewed documented beta blocker date and time   Airway Mallampati: II  TM Distance: >3 FB Neck ROM: full    Dental no notable dental hx.    Pulmonary  breath sounds clear to auscultation  Pulmonary exam normal       Cardiovascular Rhythm:regular Rate:Normal     Neuro/Psych    GI/Hepatic   Endo/Other  Hypothyroidism   Renal/GU      Musculoskeletal   Abdominal   Peds  Hematology   Anesthesia Other Findings   Reproductive/Obstetrics                             Anesthesia Physical Anesthesia Plan  ASA: II  Anesthesia Plan: General and MAC   Post-op Pain Management:    Induction: Intravenous  Airway Management Planned: LMA, Mask and Natural Airway  Additional Equipment:   Intra-op Plan:   Post-operative Plan:   Informed Consent: I have reviewed the patients History and Physical, chart, labs and discussed the procedure including the risks, benefits and alternatives for the proposed anesthesia with the patient or authorized representative who has indicated his/her understanding and acceptance.   Dental Advisory Given and Dental advisory given  Plan Discussed with: CRNA and Surgeon  Anesthesia Plan Comments: (Discussed MAC or perhaps GA with LMA, possible sore throat, potential need to switch to ETT, N/V, pulmonary aspiration. Questions answered. )       Anesthesia Quick Evaluation

## 2015-02-13 ENCOUNTER — Encounter (HOSPITAL_COMMUNITY): Payer: Self-pay | Admitting: Obstetrics and Gynecology

## 2015-02-13 NOTE — Op Note (Signed)
NAMAddison Naegeli:  Rolon, Presleigh              ACCOUNT NO.:  [id number]  MEDICAL RECORD NO.:  [id number]  LOCATION:  [facility]                          FACILITY:  [facility]  PHYSICIAN:  Lamoine Fredricksen L. Saige Busby, M.D.DATE OF BIRTH:  [birth year]  DATE OF PROCEDURE:  02/12/2015 DATE OF DISCHARGE:  02/12/2015                              OPERATIVE REPORT   PREOPERATIVE DIAGNOSIS:  Missed abortion.  POSTOPERATIVE DIAGNOSIS:  Missed abortion.  PROCEDURE:  D and E with ultrasound guidance.  SURGEON:  Peyten Punches L. Vincente PoliGrewal, M.D.  ANESTHESIA:  LMA.  EBL:  500 mL.  COMPLICATIONS:  None.  DRAINS:  Foley catheter.  DESCRIPTION OF PROCEDURE:  The patient was taken to the operating room. After informed consent was obtained, the patient was prepped and draped in usual sterile fashion.  In-and-out catheter was used to empty the bladder.  The speculum was grasped with a tenaculum and the cervical internal os was gently dilated using ultrasound guidance for the entire procedure and the [facility] dil[facility] were used to dilate the cervical os. The #8 suction cannula was inserted and the uterus was thoroughly suctioned, I could see the sac.  We were able to release the sac from the wall and suctioned products of conception.  The patient did have a period of time where there was a moderate amount of bleeding and we could see a placental fragment at the fundus, which then I curetted with a sharp curette and then we were able to retrieve it with the suction curette.  The entire procedure was done with ultrasound guidance.  At the end of the procedure, the patient was little boggy, so I advised the CRNA to give her Methergine, which she did.  I also performed some uterine massage, which helped tremendously.  A final ultrasound then showed that the uterine cavity was clean and the uterus was anteverted. There was no bleeding noted.  All sponge, lap, and instrument counts were correct x2.  The patient went to the recovery room in  stable condition.     Adrea Sherpa L. Vincente PoliGrewal, M.D.    Florestine AversMLG/MEDQ  D:  02/12/2015  T:  02/13/2015  Job:  [id number]

## 2015-08-09 ENCOUNTER — Emergency Department (HOSPITAL_COMMUNITY): Payer: BC Managed Care – PPO

## 2015-08-09 ENCOUNTER — Encounter (HOSPITAL_COMMUNITY): Payer: Self-pay | Admitting: Emergency Medicine

## 2015-08-09 ENCOUNTER — Emergency Department (HOSPITAL_COMMUNITY)
Admission: EM | Admit: 2015-08-09 | Discharge: 2015-08-10 | Disposition: A | Discharge status: Home / Self Care | Payer: BC Managed Care – PPO | Attending: Emergency Medicine | Admitting: Emergency Medicine

## 2015-08-09 DIAGNOSIS — G4489 Other headache syndrome: Secondary | ICD-10-CM | POA: Diagnosis not present

## 2015-08-09 DIAGNOSIS — Z3202 Encounter for pregnancy test, result negative: Secondary | ICD-10-CM | POA: Insufficient documentation

## 2015-08-09 DIAGNOSIS — R110 Nausea: Secondary | ICD-10-CM | POA: Diagnosis present

## 2015-08-09 DIAGNOSIS — Z8619 Personal history of other infectious and parasitic diseases: Secondary | ICD-10-CM | POA: Insufficient documentation

## 2015-08-09 DIAGNOSIS — E039 Hypothyroidism, unspecified: Secondary | ICD-10-CM | POA: Diagnosis not present

## 2015-08-09 DIAGNOSIS — Z79899 Other long term (current) drug therapy: Secondary | ICD-10-CM | POA: Insufficient documentation

## 2015-08-09 LAB — BASIC METABOLIC PANEL
Anion gap: 8 (ref 5–15)
BUN: 15 mg/dL (ref 6–20)
CALCIUM: 9.5 mg/dL (ref 8.9–10.3)
CO2: 25 mmol/L (ref 22–32)
Chloride: 104 mmol/L (ref 101–111)
Creatinine, Ser: 0.66 mg/dL (ref 0.44–1.00)
GFR calc Af Amer: >60 mL/min (ref >60)
GLUCOSE: 121 mg/dL — AB (ref 65–99)
Potassium: 3.7 mmol/L (ref 3.5–5.1)
SODIUM: 137 mmol/L (ref 135–145)

## 2015-08-09 LAB — CBC WITH DIFFERENTIAL/PLATELET
Basophils Absolute: 0 10*3/uL (ref 0.0–0.1)
Basophils Relative: 0 %
EOS ABS: 0 10*3/uL (ref 0.0–0.7)
Eosinophils Relative: 0 %
HCT: 40.6 % (ref 36.0–46.0)
HEMOGLOBIN: 13.6 g/dL (ref 12.0–15.0)
LYMPHS ABS: 1.2 10*3/uL (ref 0.7–4.0)
Lymphocytes Relative: 11 %
MCH: 29.7 pg (ref 26.0–34.0)
MCHC: 33.5 g/dL (ref 30.0–36.0)
MCV: 88.6 fL (ref 78.0–100.0)
Monocytes Absolute: 0.9 10*3/uL (ref 0.1–1.0)
Monocytes Relative: 8 %
NEUTROS PCT: 81 %
Neutro Abs: 8.4 10*3/uL — ABNORMAL HIGH (ref 1.7–7.7)
PLATELETS: 184 10*3/uL (ref 150–400)
RBC: 4.58 MIL/uL (ref 3.87–5.11)
RDW: 12.9 % (ref 11.5–15.5)
WBC: 10.6 10*3/uL — ABNORMAL HIGH (ref 4.0–10.5)

## 2015-08-09 LAB — POC URINE PREG, ED: Preg Test, Ur: NEGATIVE

## 2015-08-09 LAB — I-STAT BETA HCG BLOOD, ED (MC, WL, AP ONLY): I-stat hCG, quantitative: <5 m[IU]/mL (ref <5)

## 2015-08-09 MED ORDER — CYCLOBENZAPRINE HCL 10 MG PO TABS: 10.0000 mg | ORAL_TABLET | Freq: Two times a day (BID) | ORAL | Status: DC | PRN

## 2015-08-09 MED ORDER — KETOROLAC TROMETHAMINE 30 MG/ML IJ SOLN
30.0000 mg | Freq: Once | INTRAMUSCULAR | Status: AC
  Administered 2015-08-09: 30 mg via INTRAVENOUS
  Filled 2015-08-09: qty 1

## 2015-08-09 MED ORDER — HYDROCODONE-ACETAMINOPHEN 5-325 MG PO TABS: 2.0000 | ORAL_TABLET | ORAL | Status: DC | PRN

## 2015-08-09 MED ORDER — SODIUM CHLORIDE 0.9 % IV BOLUS (SEPSIS)
1000.0000 mL | Freq: Once | INTRAVENOUS | Status: AC
  Administered 2015-08-09: 1000 mL via INTRAVENOUS

## 2015-08-09 NOTE — Discharge Instructions (Signed)
Take vicodin as needed for pain. Take flexeril as needed for muscle spasm. Refer to attached documents for more information. Follow up with the recommended Neurologist. Return to the ED with worsening or concerning symptoms.

## 2015-08-09 NOTE — ED Provider Notes (Signed)
CSN: [id number]     Arrival date & time 08/09/15  2100 History   First MD Initiated Contact with Patient 08/09/15 2110     Chief Complaint  Patient presents with  . Headache     (Consider location/radiation/quality/duration/timing/severity/associated sxs/prior Treatment) HPI Comments: Patient is a 24 year old female who presents with a headache that started today around 11am. Patient reports a gradual onset and progressive worsening of the headache. The pain is aching, constant and is located in generalized head without radiation. Patient has tried tylenole for symptoms without relief. No alleviating/aggravating factors. Patient reports associated nausea and floaters. She also reports having bilateral hand numbness that resolves after a few minutes of her headache onset. Patient reports having a few episodes that are similar over the past week. Patient denies fever, vomiting, diarrhea, numbness/tingling, weakness, vision loss, congestion, chest pain, SOB, abdominal pain.      Past Medical History  Diagnosis Date  . Oral herpes   . Goiter     Hashimoto's thyroiditis  . HPV (human papilloma virus) anogenital infection     not high risk  . H/O varicella   . Hypothyroidism   . SVD (spontaneous vaginal delivery)     X 1   Past Surgical History  Procedure Laterality Date  . Teratoma removal    . Cystectomy      dermoid cyst  . Oophorectomy  2001    left  . Wisdom tooth extraction    . Dilation and evacuation N/A 02/12/2015    Procedure: DILATATION AND EVACUATION WITH ULTRASOUND GUIDANCE;  Surgeon: Marcelle OverlieMichelle Grewal, MD;  Location: [facility];  Service: Gynecology;  Laterality: N/A;   Family History  Problem Relation Age of Onset  . Heart disease Maternal Grandmother     CAD  . Diabetes Maternal Grandfather   . Heart disease Paternal Grandfather     CAD  . Hypertension Paternal Grandfather   . Diabetes Paternal Grandfather   . Urolithiasis Sister   . Hypertension Mother   . Evelene CroonWolff  Parkinson White syndrome Mother   . Urolithiasis Mother   . Cancer Mother     breast also melanoma  . Hypertension Paternal Grandmother   . Diabetes Paternal Grandmother   . Heart disease Paternal Grandmother    Social History  Substance Use Topics  . Smoking status: Never Smoker   . Smokeless tobacco: Never Used  . Alcohol Use: No   OB History    Gravida Para Term Preterm AB TAB SAB Ectopic Multiple Living   2 1 1  0 0 0 0 0 0 1     Review of Systems  Neurological: Positive for numbness and headaches.  All other systems reviewed and are negative.     Allergies  Review of patient's allergies indicates no known allergies.  Home Medications   Prior to Admission medications   Medication Sig Start Date End Date Taking? Authorizing Provider  acetaminophen (TYLENOL) 500 MG tablet Take 500-1,500 mg by mouth every 6 (six) hours as needed for mild pain.   Yes Historical Provider, MD  HYDROCODONE-ACETAMINOPHEN PO Take 1 tablet by mouth once.   Yes Historical Provider, MD  SYNTHROID 50 MCG tablet TAKE 1 TABLET (50 MCG TOTAL) BY MOUTH DAILY. Patient taking differently: Take 50 mcg by mouth daily before breakfast.  08/06/14  Yes Judy PimpleMarne A Tower, MD  methylergonovine (METHERGINE) 0.2 MG tablet Take 1 tablet (0.2 mg total) by mouth every 6 (six) hours. Patient not taking: Reported on 08/09/2015 02/12/15  Marcelle Overlie, MD   BP 129/80 mmHg  Pulse 87  Temp(Src) 97.3 F (36.3 C) (Oral)  Resp 16  Ht  (1.702 m)  Wt 162 lb 3 oz (73.568 kg)  BMI 25.40 kg/m2  SpO2 100%  LMP   Breastfeeding? Unknown Physical Exam  Constitutional: She is oriented to person, place, and time. She appears well-developed and well-nourished. No distress.  HENT:  Head: Normocephalic and atraumatic.  Mouth/Throat: Oropharynx is clear and moist. No oropharyngeal exudate.  Eyes: Conjunctivae and EOM are normal. Pupils are equal, round, and reactive to light.  Neck: Normal range of motion.  Cardiovascular:  Normal rate and regular rhythm.  Exam reveals no gallop and no friction rub.   No murmur heard. Pulmonary/Chest: Effort normal and breath sounds normal. She has no wheezes. She has no rales. She exhibits no tenderness.  Abdominal: Soft. She exhibits no distension. There is no tenderness. There is no rebound.  Musculoskeletal: Normal range of motion.  Neurological: She is alert and oriented to person, place, and time. No cranial nerve deficit. Coordination normal.  Speech is goal-oriented. Moves limbs without ataxia.   Skin: Skin is warm and dry.  Psychiatric: She has a normal mood and affect. Her behavior is normal.  Nursing note and vitals reviewed.   ED Course  Procedures (including critical care time) Labs Review Labs Reviewed  CBC WITH DIFFERENTIAL/PLATELET - Abnormal; Notable for the following:    WBC 10.6 (*)    Neutro Abs 8.4 (*)    All other components within normal limits  BASIC METABOLIC PANEL - Abnormal; Notable for the following:    Glucose, Bld 121 (*)    All other components within normal limits  POC URINE PREG, ED  I-STAT BETA HCG BLOOD, ED ([facility], [facility], [facility] ONLY)    Imaging Review Ct Head Wo Contrast  08/09/2015  CLINICAL DATA:  Headache with bilateral hand numbness. EXAM: CT HEAD WITHOUT CONTRAST TECHNIQUE: Contiguous axial images were obtained from the base of the skull through the vertex without intravenous contrast. COMPARISON:  None. FINDINGS: Skull and Sinuses:Negative for fracture or destructive process. The mastoids, middle ears, and imaged paranasal sinuses are clear. Benign thinning of the posterior right parietal bone, usually from remote trauma. Orbits: No acute abnormality. Brain: No evidence of acute infarction, hemorrhage, hydrocephalus, or mass lesion/mass effect. Dilated perivascular space versus choroid fissure cyst on the right. IMPRESSION: Negative head CT.  No explanation for headache. Electronically Signed   By: Marnee Spring M.D.   On: 08/09/2015 22:27    I have personally reviewed and evaluated these images and lab results as part of my medical decision-making.   EKG Interpretation None      MDM   Final diagnoses:  Other headache syndrome    9:49 PM Labs and CT head pending. If patient is not pregnant, she will have IV toradol. Vitals stable and patient afebrile.   11:06 PM CT head unremarkable for acute changes. Labs unremarkable for acute changes. Patient is not pregnant at this time. Patient will have toradol for headache. Patient refuses reglan here because her friend had an adverse reaction. Patient likely has a complicated migraine and will be referred to Neurology for further evaluation.    Emilia Beck, PA-C 08/10/15 0305  Raeford Razor, MD 08/13/15 2202

## 2015-08-09 NOTE — ED Notes (Addendum)
C/o headache on Wednesday and Thursday with approx 5 min episode of bilateral hand numbness and slurred speech.  States she felt fine Friday and Saturday.  Reports headache again today with bilateral hand numbness that lasted a few minutes but denies slurred speech today. States she may be pregnant.

## 2015-08-14 ENCOUNTER — Ambulatory Visit (INDEPENDENT_AMBULATORY_CARE_PROVIDER_SITE_OTHER): Payer: BC Managed Care – PPO | Admitting: Neurology

## 2015-08-14 ENCOUNTER — Encounter: Payer: Self-pay | Admitting: Neurology

## 2015-08-14 VITALS — BP 123/78 | HR 96 | Ht 67.0 in | Wt 163.5 lb

## 2015-08-14 DIAGNOSIS — G43109 Migraine with aura, not intractable, without status migrainosus: Secondary | ICD-10-CM

## 2015-08-14 DIAGNOSIS — G43111 Migraine with aura, intractable, with status migrainosus: Secondary | ICD-10-CM

## 2015-08-14 HISTORY — DX: Migraine with aura, not intractable, without status migrainosus: G43.109

## 2015-08-14 MED ORDER — PREDNISONE 5 MG PO TABS: ORAL_TABLET | ORAL | Status: DC

## 2015-08-14 MED ORDER — SUMATRIPTAN SUCCINATE 100 MG PO TABS: 100.0000 mg | ORAL_TABLET | Freq: Two times a day (BID) | ORAL | Status: DC | PRN

## 2015-08-14 NOTE — Patient Instructions (Signed)

## 2015-08-14 NOTE — Progress Notes (Signed)
Reason for visit: Headache  Referring physician: [place]  JAMIAH HOMEYER is a 24 y.o. female  History of present illness:  Ms. Boudreau is a 24 year old right-handed white female with a history of headache. She indicates that during her adult life, she will have occasional headaches, at times there may be spots in the vision that are mainly off to the left and inferior. The headaches are usually not a problem. Within the last 9 days, the patient began developing daily headaches, however. The patient indicates that the headaches may be in the occipital area or in the temporal regions. The headaches may last several hours, or last all day long. The patient may have the spots in front of the eyes, again on the left side in the inferior visual field. The patient will have some nausea and occasional vomiting. She will note some numbness in the hands and feet at times. She will note that there may be some clouding of her ability to process information. Light and sound bother her during the headache. The patient denies any weakness of extremities, balance issues, or difficulty controlling the bowels or the bladder. The patient became concerned about the headache, and went to the emergency room. A CT scan of the brain was done and was unremarkable. The patient reports no fevers or chills, or difficulty with night sweats. She does report some neck stiffness. She was put on Flexeril, but she continues to have a headache. She generally is not missing work because of the headache. She comes to this office for an evaluation. She does not know of any particular activating factors for the headache.  Past Medical History  Diagnosis Date  . Oral herpes   . Goiter     Hashimoto's thyroiditis  . HPV (human papilloma virus) anogenital infection     not high risk  . H/O varicella   . Hypothyroidism   . SVD (spontaneous vaginal delivery)     X 1  . Classic migraine 08/14/2015    Past Surgical History    Procedure Laterality Date  . Teratoma removal    . Cystectomy      dermoid cyst  . Oophorectomy  2001    left  . Wisdom tooth extraction    . Dilation and evacuation N/A 02/12/2015    Procedure: DILATATION AND EVACUATION WITH ULTRASOUND GUIDANCE;  Surgeon: Marcelle Overlie, MD;  Location: [facility];  Service: Gynecology;  Laterality: N/A;    Family History  Problem Relation Age of Onset  . Heart disease Maternal Grandmother     CAD  . Diabetes Maternal Grandfather   . Heart disease Paternal Grandfather     CAD  . Hypertension Paternal Grandfather   . Diabetes Paternal Grandfather   . Urolithiasis Sister   . Hypertension Mother   . Evelene Croon Parkinson White syndrome Mother   . Urolithiasis Mother   . Cancer Mother     breast also melanoma  . Hypertension Paternal Grandmother   . Diabetes Paternal Grandmother   . Heart disease Paternal Grandmother   . Migraines Neg Hx     Social history:  reports that she has never smoked. She has never used smokeless tobacco. She reports that she does not drink alcohol or use illicit drugs.  Medications:  Prior to Admission medications   Medication Sig Start Date End Date Taking? Authorizing Provider  acetaminophen (TYLENOL) 500 MG tablet Take 500-1,500 mg by mouth every 6 (six) hours as needed for mild pain.  Yes Historical Provider, MD  cyclobenzaprine (FLEXERIL) 10 MG tablet Take 1 tablet (10 mg total) by mouth 2 (two) times daily as needed for muscle spasms. Patient not taking: Reported on 08/14/2015 08/09/15   Emilia Beck, PA-C  predniSONE (DELTASONE) 5 MG tablet Begin taking 6 tablets daily, taper by one tablet daily until off the medication. 08/14/15   York Spaniel, MD  SUMAtriptan (IMITREX) 100 MG tablet Take 1 tablet (100 mg total) by mouth 2 (two) times daily as needed for migraine. 08/14/15   York Spaniel, MD  SYNTHROID 50 MCG tablet TAKE 1 TABLET (50 MCG TOTAL) BY MOUTH DAILY. Patient taking differently: Take 50 mcg by  mouth daily before breakfast.  08/06/14   Judy Pimple, MD     No Known Allergies  ROS:  Out of a complete 14 system review of symptoms, the patient complains only of the following symptoms, and all other reviewed systems are negative.  Ringing in the ears, dizziness Blurred vision Constipation Increased thirst Achy muscles Headache, numbness, weakness, slurred speech, dizziness  Blood pressure 123/78, pulse 96, height  (1.702 m), weight 163 lb 8 oz (74.163 kg), unknown if currently breastfeeding.  Physical Exam  General: The patient is alert and cooperative at the time of the examination.  Eyes: Pupils are equal, round, and reactive to light. Discs are flat bilaterally.  Neck: The neck is supple, no carotid bruits are noted.  Respiratory: The respiratory examination is clear.  Cardiovascular: The cardiovascular examination reveals a regular rate and rhythm, no obvious murmurs or rubs are noted.  Neuromuscular: Range of movement of the cervical spine is full. No crepitus is noted in the temporomandibular joints.  Skin: Extremities are without significant edema.  Neurologic Exam  Mental status: The patient is alert and oriented x 3 at the time of the examination. The patient has apparent normal recent and remote memory, with an apparently normal attention span and concentration ability.  Cranial nerves: Facial symmetry is present. There is good sensation of the face to pinprick and soft touch bilaterally. The strength of the facial muscles and the muscles to head turning and shoulder shrug are normal bilaterally. Speech is well enunciated, no aphasia or dysarthria is noted. Extraocular movements are full. Visual fields are full. The tongue is midline, and the patient has symmetric elevation of the soft palate. No obvious hearing deficits are noted.  Motor: The motor testing reveals 5 over 5 strength of all 4 extremities. Good symmetric motor tone is noted  throughout.  Sensory: Sensory testing is intact to pinprick, soft touch, vibration sensation, and position sense on all 4 extremities. No evidence of extinction is noted.  Coordination: Cerebellar testing reveals good finger-nose-finger and heel-to-shin bilaterally.  Gait and station: Gait is normal. Tandem gait is normal. Romberg is negative. No drift is seen.  Reflexes: Deep tendon reflexes are symmetric and normal bilaterally. Toes are downgoing bilaterally.   CT head 08/09/15:  IMPRESSION: Negative head CT. No explanation for headache.  * CT scan images were reviewed online. I agree with the written report.    Assessment/Plan:  1. Classic migraine  The patient appears to have classic migraine headaches. The patient is having some visual disturbance and headache, she has had these events previously, but the headaches have converted to daily within the last 9 days. The patient will be placed on a prednisone Dosepak, 5 mg 6 day pack. Imitrex will be given to take if needed. If the headaches do not abate,  she is to contact our office, and we will initiate daily therapy for the headache. The patient will come back to the office if needed.  Marlan Palau. Keith Willis MD 08/14/2015 1:05 PM  [facility] [address] [place], [place] [id number]  Phone [phone] Fax [phone]

## 2015-08-26 ENCOUNTER — Encounter: Payer: Self-pay | Admitting: *Deleted

## 2015-08-26 ENCOUNTER — Encounter: Payer: Self-pay | Admitting: Family Medicine

## 2015-08-26 ENCOUNTER — Ambulatory Visit (INDEPENDENT_AMBULATORY_CARE_PROVIDER_SITE_OTHER): Payer: BC Managed Care – PPO | Admitting: Family Medicine

## 2015-08-26 ENCOUNTER — Ambulatory Visit: Payer: BC Managed Care – PPO | Admitting: Family Medicine

## 2015-08-26 VITALS — BP 114/70 | HR 69 | Temp 98.3°F | Ht 66.0 in | Wt 163.0 lb

## 2015-08-26 DIAGNOSIS — E038 Other specified hypothyroidism: Secondary | ICD-10-CM | POA: Diagnosis not present

## 2015-08-26 DIAGNOSIS — E049 Nontoxic goiter, unspecified: Secondary | ICD-10-CM | POA: Diagnosis not present

## 2015-08-26 DIAGNOSIS — Z23 Encounter for immunization: Secondary | ICD-10-CM

## 2015-08-26 DIAGNOSIS — Z13220 Encounter for screening for lipoid disorders: Secondary | ICD-10-CM

## 2015-08-26 LAB — TSH: TSH: 0.76 u[IU]/mL (ref 0.35–4.50)

## 2015-08-26 LAB — LIPID PANEL
CHOLESTEROL: 185 mg/dL (ref 0–200)
HDL: 59.5 mg/dL (ref >39.00)
LDL Cholesterol: 111 mg/dL — ABNORMAL HIGH (ref 0–99)
NONHDL: 125.49
Total CHOL/HDL Ratio: 3
Triglycerides: 70 mg/dL (ref 0.0–149.0)
VLDL: 14 mg/dL (ref 0.0–40.0)

## 2015-08-26 NOTE — Progress Notes (Signed)
Subjective:    Patient ID: Robin Pollard, female    DOB: December 02, 1990, 24 y.o.   MRN: [id number]  HPI Here for f/u of chronic health problems   Wt is stable with bmi of 26  Had a D and C since last visit  Dx with migraines - they have tapered off recently however (imitrex for prn use)  Neurology is treating   She is trying to get pregnant (not on PNV)    Thinks her thyroid is ok  Hypothyroidism  Pt has no clinical changes No change in energy level/ hair or skin/ edema and no tremor Lab Results  Component Value Date   TSH 0.83 08/06/2014    Due for lab for that   Also cholesterol screen  Eats pretty healthy   Has not seen endocrinologist since middle school  Had a bx  Hashimotos thyroiditis   Patient Active Problem List   Diagnosis Date Noted  . Classic migraine 08/14/2015  . Yeast vaginitis 08/06/2014  . Pre-employment examination 04/11/2011  . PURE HYPERCHOLESTEROLEMIA 05/13/2010  . Hypothyroidism 11/13/2009  . Goiter 05/12/2008  . HUMAN PAPILLOMAVIRUS 05/11/2007  . ABFND PAP SMEAR LGSIL 05/11/2007   Past Medical History  Diagnosis Date  . Oral herpes   . Goiter     Hashimoto's thyroiditis  . HPV (human papilloma virus) anogenital infection     not high risk  . H/O varicella   . Hypothyroidism   . SVD (spontaneous vaginal delivery)     X 1  . Classic migraine 08/14/2015   Past Surgical History  Procedure Laterality Date  . Teratoma removal    . Cystectomy      dermoid cyst  . Oophorectomy  2001    left  . Wisdom tooth extraction    . Dilation and evacuation N/A 02/12/2015    Procedure: DILATATION AND EVACUATION WITH ULTRASOUND GUIDANCE;  Surgeon: Marcelle OverlieMichelle Grewal, MD;  Location: [facility];  Service: Gynecology;  Laterality: N/A;   Social History  Substance Use Topics  . Smoking status: Never Smoker   . Smokeless tobacco: Never Used  . Alcohol Use: No   Family History  Problem Relation Age of Onset  . Heart disease Maternal Grandmother     CAD   . Diabetes Maternal Grandfather   . Heart disease Paternal Grandfather     CAD  . Hypertension Paternal Grandfather   . Diabetes Paternal Grandfather   . Urolithiasis Sister   . Hypertension Mother   . Evelene CroonWolff Parkinson White syndrome Mother   . Urolithiasis Mother   . Cancer Mother     breast also melanoma  . Hypertension Paternal Grandmother   . Diabetes Paternal Grandmother   . Heart disease Paternal Grandmother   . Migraines Neg Hx    No Known Allergies Current Outpatient Prescriptions on File Prior to Visit  Medication Sig Dispense Refill  . SUMAtriptan (IMITREX) 100 MG tablet Take 1 tablet (100 mg total) by mouth 2 (two) times daily as needed for migraine. 10 tablet 2  . SYNTHROID 50 MCG tablet TAKE 1 TABLET (50 MCG TOTAL) BY MOUTH DAILY. (Patient taking differently: Take 50 mcg by mouth daily before breakfast. ) 30 tablet 11   No current facility-administered medications on file prior to visit.     Review of Systems Review of Systems  Constitutional: Negative for fever, appetite change, fatigue and unexpected weight change.  Eyes: Negative for pain and visual disturbance.  Respiratory: Negative for cough and shortness of breath.  Cardiovascular: Negative for cp or palpitations    Gastrointestinal: Negative for nausea, diarrhea and constipation.  Genitourinary: Negative for urgency and frequency.  Skin: Negative for pallor or rash   Neurological: Negative for weakness, light-headedness, numbness and headaches.  Hematological: Negative for adenopathy. Does not bruise/bleed easily.  Psychiatric/Behavioral: Negative for dysphoric mood. The patient is not nervous/anxious.         Objective:   Physical Exam  Constitutional: She appears well-developed and well-nourished. No distress.  Well appearing   HENT:  Head: Normocephalic and atraumatic.  Mouth/Throat: Oropharynx is clear and moist.  Eyes: Conjunctivae and EOM are normal. Pupils are equal, round, and reactive to  light.  Neck: Normal range of motion. Neck supple. No JVD present. Carotid bruit is not present. Thyromegaly present.  No thyroid tenderness or bruit  Cardiovascular: Normal rate, regular rhythm, normal heart sounds and intact distal pulses.  Exam reveals no gallop.   Pulmonary/Chest: Effort normal and breath sounds normal. No respiratory distress. She has no wheezes. She has no rales.  No crackles  Abdominal: Soft. Bowel sounds are normal. She exhibits no distension, no abdominal bruit and no mass. There is no tenderness.  Musculoskeletal: She exhibits no edema.  Lymphadenopathy:    She has no cervical adenopathy.  Neurological: She is alert. She has normal reflexes. She displays no tremor. No cranial nerve deficit. She exhibits normal muscle tone. Coordination normal.  Skin: Skin is warm and dry. No rash noted.  Psychiatric: She has a normal mood and affect.          Assessment & Plan:   Problem List Items Addressed This Visit      Endocrine   Goiter    No changes TSH today      Relevant Orders   TSH (Completed)   Hypothyroidism    Due for TSH Hx of Hashimotos thyroiditis  No clinical changes  No change in goiter       Relevant Orders   TSH (Completed)     Other   Screening for lipoid disorders    Lipid screen today in hypothyroid pt with fairly good diet       Relevant Orders   Lipid panel (Completed)    Other Visit Diagnoses    Need for influenza vaccination    -  Primary    Relevant Orders    Flu Vaccine QUAD 36+ mos PF IM (Fluarix & Fluzone Quad PF) (Completed)

## 2015-08-26 NOTE — Patient Instructions (Signed)
Take care of yourself  Drink lots of water and avoid caffeine Take prenatal vitamin daily  Lab today

## 2015-08-26 NOTE — Progress Notes (Signed)
Pre visit review using our clinic review tool, if applicable. No additional management support is needed unless otherwise documented below in the visit note. 

## 2015-08-28 ENCOUNTER — Other Ambulatory Visit: Payer: Self-pay | Admitting: Family Medicine

## 2015-08-30 NOTE — Assessment & Plan Note (Signed)
Due for TSH Hx of Hashimotos thyroiditis  No clinical changes  No change in goiter

## 2015-08-30 NOTE — Assessment & Plan Note (Signed)
Lipid screen today in hypothyroid pt with fairly good diet

## 2015-08-30 NOTE — Assessment & Plan Note (Signed)
No changes TSH today

## 2015-12-22 LAB — OB RESULTS CONSOLE HEPATITIS B SURFACE ANTIGEN: HEP B S AG: NEGATIVE

## 2015-12-22 LAB — OB RESULTS CONSOLE ABO/RH: RH TYPE: POSITIVE

## 2015-12-22 LAB — OB RESULTS CONSOLE ANTIBODY SCREEN: Antibody Screen: NEGATIVE

## 2015-12-22 LAB — OB RESULTS CONSOLE HIV ANTIBODY (ROUTINE TESTING): HIV: NONREACTIVE

## 2015-12-22 LAB — OB RESULTS CONSOLE RUBELLA ANTIBODY, IGM: RUBELLA: IMMUNE

## 2015-12-22 LAB — OB RESULTS CONSOLE RPR: RPR: NONREACTIVE

## 2016-01-04 DIAGNOSIS — Z3491 Encounter for supervision of normal pregnancy, unspecified, first trimester: Secondary | ICD-10-CM | POA: Diagnosis not present

## 2016-01-04 DIAGNOSIS — Z36 Encounter for antenatal screening of mother: Secondary | ICD-10-CM | POA: Diagnosis not present

## 2016-01-04 LAB — OB RESULTS CONSOLE GC/CHLAMYDIA
Chlamydia: NEGATIVE
GC PROBE AMP, GENITAL: NEGATIVE

## 2016-01-18 DIAGNOSIS — Z3A12 12 weeks gestation of pregnancy: Secondary | ICD-10-CM | POA: Diagnosis not present

## 2016-01-18 DIAGNOSIS — Z3491 Encounter for supervision of normal pregnancy, unspecified, first trimester: Secondary | ICD-10-CM | POA: Diagnosis not present

## 2016-01-18 DIAGNOSIS — Z36 Encounter for antenatal screening of mother: Secondary | ICD-10-CM | POA: Diagnosis not present

## 2016-03-01 DIAGNOSIS — Z36 Encounter for antenatal screening of mother: Secondary | ICD-10-CM | POA: Diagnosis not present

## 2016-03-01 DIAGNOSIS — Z3A18 18 weeks gestation of pregnancy: Secondary | ICD-10-CM | POA: Diagnosis not present

## 2016-03-30 DIAGNOSIS — O350XX0 Maternal care for (suspected) central nervous system malformation in fetus, not applicable or unspecified: Secondary | ICD-10-CM | POA: Diagnosis not present

## 2016-03-30 DIAGNOSIS — Z3A22 22 weeks gestation of pregnancy: Secondary | ICD-10-CM | POA: Diagnosis not present

## 2016-04-06 DIAGNOSIS — N390 Urinary tract infection, site not specified: Secondary | ICD-10-CM | POA: Diagnosis not present

## 2016-04-06 LAB — OB RESULTS CONSOLE GBS: GBS: NEGATIVE

## 2016-05-05 DIAGNOSIS — Z23 Encounter for immunization: Secondary | ICD-10-CM | POA: Diagnosis not present

## 2016-05-05 DIAGNOSIS — Z36 Encounter for antenatal screening of mother: Secondary | ICD-10-CM | POA: Diagnosis not present

## 2016-05-19 DIAGNOSIS — Z3A29 29 weeks gestation of pregnancy: Secondary | ICD-10-CM | POA: Diagnosis not present

## 2016-05-19 DIAGNOSIS — Z36 Encounter for antenatal screening of mother: Secondary | ICD-10-CM | POA: Diagnosis not present

## 2016-06-28 DIAGNOSIS — Z36 Encounter for antenatal screening of mother: Secondary | ICD-10-CM | POA: Diagnosis not present

## 2016-07-05 DIAGNOSIS — Z23 Encounter for immunization: Secondary | ICD-10-CM | POA: Diagnosis not present

## 2016-07-25 ENCOUNTER — Encounter (HOSPITAL_COMMUNITY): Payer: Self-pay | Admitting: *Deleted

## 2016-07-25 ENCOUNTER — Inpatient Hospital Stay (HOSPITAL_COMMUNITY)
Admission: AD | Admit: 2016-07-25 | Discharge: 2016-07-28 | DRG: 775 | Disposition: A | Discharge status: Home / Self Care | Payer: BLUE CROSS/BLUE SHIELD | Source: Ambulatory Visit | Attending: Obstetrics and Gynecology | Admitting: Obstetrics and Gynecology

## 2016-07-25 DIAGNOSIS — Z3A39 39 weeks gestation of pregnancy: Secondary | ICD-10-CM

## 2016-07-25 DIAGNOSIS — Z8249 Family history of ischemic heart disease and other diseases of the circulatory system: Secondary | ICD-10-CM

## 2016-07-25 DIAGNOSIS — Z833 Family history of diabetes mellitus: Secondary | ICD-10-CM

## 2016-07-25 DIAGNOSIS — Z3483 Encounter for supervision of other normal pregnancy, third trimester: Secondary | ICD-10-CM | POA: Diagnosis not present

## 2016-07-25 MED ORDER — LACTATED RINGERS IV SOLN
INTRAVENOUS | Status: DC
  Administered 2016-07-26 (×2): via INTRAVENOUS
  Administered 2016-07-26: 125 mL/h via INTRAVENOUS
  Administered 2016-07-26: 07:00:00 via INTRAVENOUS

## 2016-07-25 NOTE — MAU Note (Signed)
Pt reports she is contracting and has been for the last 4 hours, denies bleeding or ROM

## 2016-07-26 ENCOUNTER — Encounter (HOSPITAL_COMMUNITY): Payer: Self-pay

## 2016-07-26 ENCOUNTER — Encounter (HOSPITAL_COMMUNITY): Payer: Self-pay | Admitting: Anesthesiology

## 2016-07-26 ENCOUNTER — Inpatient Hospital Stay (HOSPITAL_COMMUNITY): Payer: BLUE CROSS/BLUE SHIELD | Admitting: Anesthesiology

## 2016-07-26 DIAGNOSIS — Z833 Family history of diabetes mellitus: Secondary | ICD-10-CM | POA: Diagnosis not present

## 2016-07-26 DIAGNOSIS — Z8249 Family history of ischemic heart disease and other diseases of the circulatory system: Secondary | ICD-10-CM | POA: Diagnosis not present

## 2016-07-26 DIAGNOSIS — Z3A39 39 weeks gestation of pregnancy: Secondary | ICD-10-CM | POA: Diagnosis not present

## 2016-07-26 DIAGNOSIS — Z3483 Encounter for supervision of other normal pregnancy, third trimester: Secondary | ICD-10-CM | POA: Diagnosis present

## 2016-07-26 LAB — RPR: RPR Ser Ql: NONREACTIVE

## 2016-07-26 LAB — CBC
HCT: 35.5 % — ABNORMAL LOW (ref 36.0–46.0)
Hemoglobin: 12.3 g/dL (ref 12.0–15.0)
MCH: 30.3 pg (ref 26.0–34.0)
MCHC: 34.6 g/dL (ref 30.0–36.0)
MCV: 87.4 fL (ref 78.0–100.0)
PLATELETS: 163 10*3/uL (ref 150–400)
RBC: 4.06 MIL/uL (ref 3.87–5.11)
RDW: 13.2 % (ref 11.5–15.5)
WBC: 13.5 10*3/uL — ABNORMAL HIGH (ref 4.0–10.5)

## 2016-07-26 LAB — TYPE AND SCREEN
ABO/RH(D): A POS
Antibody Screen: NEGATIVE

## 2016-07-26 MED ORDER — ONDANSETRON HCL 4 MG/2ML IJ SOLN: 4.0000 mg | Freq: Four times a day (QID) | INTRAMUSCULAR | Status: DC | PRN

## 2016-07-26 MED ORDER — LACTATED RINGERS IV SOLN
500.0000 mL | Freq: Once | INTRAVENOUS | Status: AC
  Administered 2016-07-26: 500 mL via INTRAVENOUS

## 2016-07-26 MED ORDER — SENNOSIDES-DOCUSATE SODIUM 8.6-50 MG PO TABS
2.0000 | ORAL_TABLET | ORAL | Status: DC
  Administered 2016-07-26 – 2016-07-27 (×2): 2 via ORAL
  Filled 2016-07-26 (×2): qty 2

## 2016-07-26 MED ORDER — SOD CITRATE-CITRIC ACID 500-334 MG/5ML PO SOLN: 30.0000 mL | ORAL | Status: DC | PRN

## 2016-07-26 MED ORDER — LIDOCAINE HCL (PF) 1 % IJ SOLN
INTRAMUSCULAR | Status: DC | PRN
Start: 2016-07-26 — End: 2016-07-26
  Administered 2016-07-26 (×2): 4 mL

## 2016-07-26 MED ORDER — LEVOTHYROXINE SODIUM 50 MCG PO TABS
50.0000 ug | ORAL_TABLET | Freq: Every day | ORAL | Status: DC
  Administered 2016-07-27 – 2016-07-28 (×2): 50 ug via ORAL
  Filled 2016-07-26 (×3): qty 1

## 2016-07-26 MED ORDER — ACETAMINOPHEN 325 MG PO TABS: 650.0000 mg | ORAL_TABLET | ORAL | Status: DC | PRN

## 2016-07-26 MED ORDER — OXYTOCIN BOLUS FROM INFUSION
500.0000 mL | Freq: Once | INTRAVENOUS | Status: AC
  Administered 2016-07-26: 500 mL via INTRAVENOUS

## 2016-07-26 MED ORDER — ONDANSETRON HCL 4 MG PO TABS: 4.0000 mg | ORAL_TABLET | ORAL | Status: DC | PRN

## 2016-07-26 MED ORDER — MEASLES, MUMPS & RUBELLA VAC ~~LOC~~ INJ: 0.5000 mL | INJECTION | Freq: Once | SUBCUTANEOUS | Status: DC

## 2016-07-26 MED ORDER — OXYTOCIN 40 UNITS IN LACTATED RINGERS INFUSION - SIMPLE MED: 1.0000 m[IU]/min | INTRAVENOUS | Status: DC

## 2016-07-26 MED ORDER — FLEET ENEMA 7-19 GM/118ML RE ENEM: 1.0000 | ENEMA | RECTAL | Status: DC | PRN

## 2016-07-26 MED ORDER — PHENYLEPHRINE 40 MCG/ML (10ML) SYRINGE FOR IV PUSH (FOR BLOOD PRESSURE SUPPORT)
80.0000 ug | PREFILLED_SYRINGE | INTRAVENOUS | Status: DC | PRN
  Administered 2016-07-26 (×2): 80 ug via INTRAVENOUS
  Filled 2016-07-26: qty 5

## 2016-07-26 MED ORDER — EPHEDRINE 5 MG/ML INJ
10.0000 mg | INTRAVENOUS | Status: DC | PRN
  Administered 2016-07-26: 10 mg via INTRAVENOUS
  Filled 2016-07-26: qty 4

## 2016-07-26 MED ORDER — LIDOCAINE HCL (PF) 1 % IJ SOLN
30.0000 mL | INTRAMUSCULAR | Status: DC | PRN
  Filled 2016-07-26: qty 30

## 2016-07-26 MED ORDER — DIBUCAINE 1 % RE OINT: 1.0000 "application " | TOPICAL_OINTMENT | RECTAL | Status: DC | PRN

## 2016-07-26 MED ORDER — PHENYLEPHRINE 40 MCG/ML (10ML) SYRINGE FOR IV PUSH (FOR BLOOD PRESSURE SUPPORT)
80.0000 ug | PREFILLED_SYRINGE | INTRAVENOUS | Status: DC | PRN
  Filled 2016-07-26: qty 5
  Filled 2016-07-26: qty 10

## 2016-07-26 MED ORDER — LACTATED RINGERS IV SOLN
INTRAVENOUS | Status: DC
  Administered 2016-07-26: 150 mL/h via INTRAUTERINE

## 2016-07-26 MED ORDER — TETANUS-DIPHTH-ACELL PERTUSSIS 5-2.5-18.5 LF-MCG/0.5 IM SUSP: 0.5000 mL | Freq: Once | INTRAMUSCULAR | Status: DC

## 2016-07-26 MED ORDER — DIPHENHYDRAMINE HCL 25 MG PO CAPS: 25.0000 mg | ORAL_CAPSULE | Freq: Four times a day (QID) | ORAL | Status: DC | PRN

## 2016-07-26 MED ORDER — OXYCODONE-ACETAMINOPHEN 5-325 MG PO TABS: 2.0000 | ORAL_TABLET | ORAL | Status: DC | PRN

## 2016-07-26 MED ORDER — LACTATED RINGERS IV SOLN
500.0000 mL | INTRAVENOUS | Status: DC | PRN
  Administered 2016-07-26 (×3): 500 mL via INTRAVENOUS

## 2016-07-26 MED ORDER — OXYTOCIN 40 UNITS IN LACTATED RINGERS INFUSION - SIMPLE MED
2.5000 [IU]/h | INTRAVENOUS | Status: DC
  Administered 2016-07-26: 2.5 [IU]/h via INTRAVENOUS
  Filled 2016-07-26: qty 1000

## 2016-07-26 MED ORDER — ONDANSETRON HCL 4 MG/2ML IJ SOLN: 4.0000 mg | INTRAMUSCULAR | Status: DC | PRN

## 2016-07-26 MED ORDER — OXYCODONE-ACETAMINOPHEN 5-325 MG PO TABS: 1.0000 | ORAL_TABLET | ORAL | Status: DC | PRN

## 2016-07-26 MED ORDER — WITCH HAZEL-GLYCERIN EX PADS: 1.0000 "application " | MEDICATED_PAD | CUTANEOUS | Status: DC | PRN

## 2016-07-26 MED ORDER — MEDROXYPROGESTERONE ACETATE 150 MG/ML IM SUSP
150.0000 mg | INTRAMUSCULAR | Status: DC | PRN
Start: 2016-07-26 — End: 2016-07-28

## 2016-07-26 MED ORDER — SIMETHICONE 80 MG PO CHEW: 80.0000 mg | CHEWABLE_TABLET | ORAL | Status: DC | PRN

## 2016-07-26 MED ORDER — IBUPROFEN 600 MG PO TABS
600.0000 mg | ORAL_TABLET | Freq: Four times a day (QID) | ORAL | Status: DC
  Administered 2016-07-26 – 2016-07-28 (×8): 600 mg via ORAL
  Filled 2016-07-26 (×8): qty 1

## 2016-07-26 MED ORDER — PRENATAL MULTIVITAMIN CH
1.0000 | ORAL_TABLET | Freq: Every day | ORAL | Status: DC
  Administered 2016-07-27 – 2016-07-28 (×2): 1 via ORAL
  Filled 2016-07-26 (×2): qty 1

## 2016-07-26 MED ORDER — COCONUT OIL OIL
1.0000 "application " | TOPICAL_OIL | Status: DC | PRN
  Administered 2016-07-28: 1 via TOPICAL
  Filled 2016-07-26: qty 120

## 2016-07-26 MED ORDER — FENTANYL 2.5 MCG/ML BUPIVACAINE 1/10 % EPIDURAL INFUSION (WH - ANES)
14.0000 mL/h | INTRAMUSCULAR | Status: DC | PRN
Start: 2016-07-26 — End: 2016-07-26
  Administered 2016-07-26 (×2): 14 mL/h via EPIDURAL
  Filled 2016-07-26 (×2): qty 125

## 2016-07-26 MED ORDER — OXYTOCIN 40 UNITS IN LACTATED RINGERS INFUSION - SIMPLE MED
1.0000 m[IU]/min | INTRAVENOUS | Status: DC
  Administered 2016-07-26: 1 m[IU]/min via INTRAVENOUS

## 2016-07-26 MED ORDER — BENZOCAINE-MENTHOL 20-0.5 % EX AERO
1.0000 "application " | INHALATION_SPRAY | CUTANEOUS | Status: DC | PRN
  Administered 2016-07-26: 1 via TOPICAL
  Filled 2016-07-26: qty 56

## 2016-07-26 MED ORDER — DIPHENHYDRAMINE HCL 50 MG/ML IJ SOLN: 12.5000 mg | INTRAMUSCULAR | Status: DC | PRN

## 2016-07-26 MED ORDER — TERBUTALINE SULFATE 1 MG/ML IJ SOLN
0.2500 mg | Freq: Once | INTRAMUSCULAR | Status: DC | PRN
  Filled 2016-07-26: qty 1

## 2016-07-26 NOTE — Anesthesia Postprocedure Evaluation (Signed)
Anesthesia Post Note  Patient: Robin Pollard  Procedure(s) Performed: * No procedures listed *  Patient location during evaluation: Mother Baby Anesthesia Type: Regional Level of consciousness: awake and alert Pain management: pain level controlled Vital Signs Assessment: post-procedure vital signs reviewed and stable Respiratory status: spontaneous breathing Cardiovascular status: blood pressure returned to baseline Postop Assessment: no headache, no backache, epidural receding, patient able to bend at knees, no signs of nausea or vomiting and adequate PO intake     Last Vitals:  Vitals:   07/26/16 1500 07/26/16 1550  BP:  117/74  Pulse:  94  Resp: 16 18  Temp:      Last Pain:  Vitals:   07/26/16 1500  TempSrc:   PainSc: 0-No pain   Pain Goal:                 Salome ArntSterling, Barnard Sharps Marie

## 2016-07-26 NOTE — Progress Notes (Signed)
Pt comfortable w/ epidural  FHT reassuring w/ variable decels Toco Q2 Cvx 8/C/0 IUPC placed  A/P:  Will adjust pitocin prn Start amnioinfusion

## 2016-07-26 NOTE — H&P (Signed)
Robin Pollard is a 25 y.o. female 263P1 @ 39+4 wks presenting for SOL.  Pregnancy uncomplicated.  OB History    Gravida Para Term Preterm AB Living   3 1 1  0 0 1   SAB TAB Ectopic Multiple Live Births   0 0 0 0 1     Past Medical History:  Diagnosis Date  . Classic migraine 08/14/2015  . Goiter    Hashimoto's thyroiditis  . H/O varicella   . HPV (human papilloma virus) anogenital infection    not high risk  . Hypothyroidism   . Oral herpes   . SVD (spontaneous vaginal delivery)    X 1   Past Surgical History:  Procedure Laterality Date  . CYSTECTOMY     dermoid cyst  . DILATION AND EVACUATION N/A 02/12/2015   Procedure: DILATATION AND EVACUATION WITH ULTRASOUND GUIDANCE;  Surgeon: Robin OverlieMichelle Grewal, MD;  Location: [facility];  Service: Gynecology;  Laterality: N/A;  . OOPHORECTOMY  2001   left  . teratoma removal    . WISDOM TOOTH EXTRACTION     Family History: family history includes Cancer in her mother; Diabetes in her maternal grandfather, paternal grandfather, and paternal grandmother; Heart disease in her maternal grandmother, paternal grandfather, and paternal grandmother; Hypertension in her mother, paternal grandfather, and paternal grandmother; Urolithiasis in her mother and sister; Wolff Parkinson White syndrome in her mother. Social History:  reports that she has never smoked. She has never used smokeless tobacco. She reports that she does not drink alcohol or use drugs.     Maternal Diabetes: No Genetic Screening: Declined Maternal Ultrasounds/Referrals: Normal Fetal Ultrasounds or other Referrals:  None Maternal Substance Abuse:  No Significant Maternal Medications:  None Significant Maternal Lab Results:  None Other Comments:  None  ROS History Dilation: 6 Effacement (%): 80 Station: -3 Exam by:: E. Johnson,RN  Blood pressure 121/65, pulse (!) 108, temperature 97.9 F (36.6 C), temperature source Oral, resp. rate 16, height 5\' 7"  (1.702 m), weight 213  lb (96.6 kg), last menstrual period 08/21/2015, SpO2 98 %, unknown if currently breastfeeding. Exam Physical Exam  Gen - NAD Abd - gravid, NT Ext - NT, no edema Cvx 6/90/-1 AROM, clear Prenatal labs: ABO, Rh: --/--/A POS (10/23 2358) Antibody: NEG (10/23 2358) Rubella: Immune (03/21 0000) RPR: Nonreactive (03/21 0000)  HBsAg: Negative (03/21 0000)  HIV: Non-reactive (03/21 0000)  GBS: Negative (07/05 0000)   Assessment/Plan: Admit Pitocin augmentation    Robin Pollard 07/26/2016, 8:11 AM

## 2016-07-26 NOTE — Progress Notes (Signed)
SVD of vigorous female infant w/ apgars of 9,9.  Placenta delivered spontaneous w/ 3VC.   1st degree lac repaired w/ 3-0 vicryl rapide.  Fundus firm.  EBL 150cc .

## 2016-07-26 NOTE — Anesthesia Rounding Note (Signed)
  [facility] Epidural Rounding Note  Patient: Robin Pollard, 25 y.o., female  Patient's current pain level: Pain Score: 0-No pain (07/26/16 0155)  Agreed upon pain management level: Patient sleeping - unable to assess  Epidural intervention: No   Comments:   [facility] CampusEIGHT,Mahki Spikes 07/26/2016

## 2016-07-26 NOTE — Anesthesia Preprocedure Evaluation (Signed)
Anesthesia Evaluation  Patient identified by MRN, date of birth, ID band Patient awake    Reviewed: Allergy & Precautions, NPO status , Patient's Chart, lab work & pertinent test results  History of Anesthesia Complications Negative for: history of anesthetic complications  Airway Mallampati: II  TM Distance: >3 FB Neck ROM: Full    Dental no notable dental hx. (+) Dental Advisory Given   Pulmonary neg pulmonary ROS,    Pulmonary exam normal breath sounds clear to auscultation       Cardiovascular negative cardio ROS Normal cardiovascular exam Rhythm:Regular Rate:Normal     Neuro/Psych  Headaches, negative psych ROS   GI/Hepatic negative GI ROS, Neg liver ROS,   Endo/Other  Hypothyroidism obesity  Renal/GU negative Renal ROS  negative genitourinary   Musculoskeletal negative musculoskeletal ROS (+)   Abdominal   Peds negative pediatric ROS (+)  Hematology negative hematology ROS (+)   Anesthesia Other Findings   Reproductive/Obstetrics (+) Pregnancy                             Anesthesia Physical Anesthesia Plan  ASA: II  Anesthesia Plan: Epidural   Post-op Pain Management:    Induction:   Airway Management Planned:   Additional Equipment:   Intra-op Plan:   Post-operative Plan:   Informed Consent: I have reviewed the patients History and Physical, chart, labs and discussed the procedure including the risks, benefits and alternatives for the proposed anesthesia with the patient or authorized representative who has indicated his/her understanding and acceptance.   Dental advisory given  Plan Discussed with: CRNA  Anesthesia Plan Comments:         Anesthesia Quick Evaluation

## 2016-07-26 NOTE — Anesthesia Procedure Notes (Signed)
Epidural Patient location during procedure: OB  Staffing Anesthesiologist: Elleni Mozingo Performed: anesthesiologist   Preanesthetic Checklist Completed: patient identified, site marked, surgical consent, pre-op evaluation, timeout performed, IV checked, risks and benefits discussed and monitors and equipment checked  Epidural Patient position: sitting Prep: site prepped and draped and DuraPrep Patient monitoring: continuous pulse ox and blood pressure Approach: midline Location: L3-L4 Injection technique: LOR saline  Needle:  Needle type: Tuohy  Needle gauge: 17 G Needle length: 9 cm and 9 Needle insertion depth: 5 cm cm Catheter type: closed end flexible Catheter size: 19 Gauge Catheter at skin depth: 10 cm Test dose: negative  Assessment Events: blood not aspirated, injection not painful, no injection resistance, negative IV test and no paresthesia  Additional Notes Patient identified. Risks/Benefits/Options discussed with patient including but not limited to bleeding, infection, nerve damage, paralysis, failed block, incomplete pain control, headache, blood pressure changes, nausea, vomiting, reactions to medication both or allergic, itching and postpartum back pain. Confirmed with bedside nurse the patient's most recent platelet count. Confirmed with patient that they are not currently taking any anticoagulation, have any bleeding history or any family history of bleeding disorders. Patient expressed understanding and wished to proceed. All questions were answered. Sterile technique was used throughout the entire procedure. Please see nursing notes for vital signs. Test dose was given through epidural catheter and negative prior to continuing to dose epidural or start infusion. Warning signs of high block given to the patient including shortness of breath, tingling/numbness in hands, complete motor block, or any concerning symptoms with instructions to call for help. Patient was  given instructions on fall risk and not to get out of bed. All questions and concerns addressed with instructions to call with any issues or inadequate analgesia.        

## 2016-07-27 LAB — CBC
HEMATOCRIT: 31.8 % — AB (ref 36.0–46.0)
HEMOGLOBIN: 10.9 g/dL — AB (ref 12.0–15.0)
MCH: 30.4 pg (ref 26.0–34.0)
MCHC: 34.3 g/dL (ref 30.0–36.0)
MCV: 88.8 fL (ref 78.0–100.0)
Platelets: 137 10*3/uL — ABNORMAL LOW (ref 150–400)
RBC: 3.58 MIL/uL — ABNORMAL LOW (ref 3.87–5.11)
RDW: 13.4 % (ref 11.5–15.5)
WBC: 14.2 10*3/uL — ABNORMAL HIGH (ref 4.0–10.5)

## 2016-07-27 NOTE — Lactation Note (Signed)
This note was copied from a baby's chart. Lactation Consultation Note: mother states that she breastfed her first child for 5 months. Mother states that she didn't have any problems with first. Mother states that she has a tiny sore spot on her R nipple. Advised mother to rotate postiions frequently. And use good breast support. Mother was given Lactation Brochures and review of basis teaching.  Patient Name: Girl Addison NaegeliJena Malson ZOXWR'UToday's Date: 07/27/2016 Reason for consult: Initial assessment   Maternal Data    Feeding Feeding Type: Breast Fed Length of feed: 10 min  LATCH Score/Interventions                      Lactation Tools Discussed/Used     Consult Status      Michel BickersKendrick, Peter Daquila McCoy 07/27/2016, 12:37 PM

## 2016-07-27 NOTE — Progress Notes (Signed)
Post Partum Day 1 Subjective: no complaints, up ad lib, voiding and tolerating PO  Objective: Blood pressure 103/66, pulse 78, temperature 97.6 F (36.4 C), resp. rate 18, height 5\' 7"  (1.702 m), weight 213 lb (96.6 kg), last menstrual period 08/21/2015, SpO2 98 %, unknown if currently breastfeeding.  Physical Exam:  General: alert and cooperative Lochia: appropriate Uterine Fundus: firm Incision: healing well DVT Evaluation: No evidence of DVT seen on physical exam. Negative Homan's sign. No cords or calf tenderness. Calf/Ankle edema is present.   Recent Labs  07/25/16 2358 07/27/16 0524  HGB 12.3 10.9*  HCT 35.5* 31.8*    Assessment/Plan: Plan for discharge tomorrow   LOS: 1 day   Anissa Abbs G 07/27/2016, 7:52 AM

## 2016-07-28 MED ORDER — IBUPROFEN 600 MG PO TABS: 600.0000 mg | ORAL_TABLET | Freq: Four times a day (QID) | ORAL | 1 refills | Status: DC

## 2016-07-28 NOTE — Lactation Note (Signed)
This note was copied from a baby's chart. Lactation Consultation Note Mother states that breastfeeding is going well. Mother has infant to the breast at this time. She describes slight soreness . Comfort gels given . Mother advised to continue to cue base feed. Discussed engorgement and treat as well as Mastitis.  Patient Name: Robin Pollard Today's Date: 07/28/2016     Maternal Data    Feeding Feeding Type: Breast Fed Length of feed: 10 min  LATCH Score/Interventions Latch: Grasps breast easily, tongue down, lips flanged, rhythmical sucking.  Audible Swallowing: A few with stimulation  Type of Nipple: Everted at rest and after stimulation  Comfort (Breast/Nipple): Filling, red/small blisters or bruises, mild/mod discomfort  Problem noted: Cracked, bleeding, blisters, bruises Interventions  (Cracked/bleeding/bruising/blister):  (coconut oil) Interventions (Mild/moderate discomfort): Hand expression  Hold (Positioning): No assistance needed to correctly position infant at breast.  LATCH Score: 8  Lactation Tools Discussed/Used WIC Program: No   Consult Status      Robin Pollard, Robin Pollard 07/28/2016, 9:45 AM

## 2016-07-28 NOTE — Discharge Summary (Signed)
Obstetric Discharge Summary Reason for Admission: onset of labor Prenatal Procedures: ultrasound Intrapartum Procedures: spontaneous vaginal delivery Postpartum Procedures: none Complications-Operative and Postpartum: 1 degree perineal laceration Hemoglobin  Date Value Ref Range Status  07/27/2016 10.9 (L) 12.0 - 15.0 g/dL Final   HCT  Date Value Ref Range Status  07/27/2016 31.8 (L) 36.0 - 46.0 % Final    Physical Exam:  General: alert and cooperative Lochia: appropriate Uterine Fundus: firm Incision: healing well DVT Evaluation: No evidence of DVT seen on physical exam. Negative Homan's sign. No cords or calf tenderness. No significant calf/ankle edema.  Discharge Diagnoses: Term Pregnancy-delivered  Discharge Information: Date: 07/28/2016 Activity: pelvic rest Diet: routine Medications: PNV, Ibuprofen and Synthroid Condition: stable Instructions: refer to practice specific booklet Discharge to: home   Newborn Data: Live born female  Birth Weight: 8 lb 7.5 oz (3840 g) APGAR: 9, 9  Home with mother.  Robin Pollard G 07/28/2016, 8:35 AM

## 2016-07-29 NOTE — Anesthesia Postprocedure Evaluation (Signed)
Anesthesia Post Note  Patient: Robin Pollard  Procedure(s) Performed: * No procedures listed *  Anesthesia Type: Epidural Vital Signs Assessment: post-procedure vital signs reviewed and stable Anesthetic complications: no    Last Vitals: There were no vitals filed for this visit.  Last Pain: There were no vitals filed for this visit.               Hanson Medeiros JENNETTE

## 2016-08-23 DIAGNOSIS — N760 Acute vaginitis: Secondary | ICD-10-CM | POA: Diagnosis not present

## 2016-08-24 ENCOUNTER — Other Ambulatory Visit: Payer: Self-pay | Admitting: Family Medicine

## 2016-08-24 NOTE — Telephone Encounter (Signed)
Please schedule f/u and refill until then  

## 2016-08-24 NOTE — Telephone Encounter (Signed)
No recent/future appts. (hasn't been seen in over a year), please advise

## 2016-08-24 NOTE — Telephone Encounter (Signed)
Left voicemail (per DPR) requesting pt to call the office and schedule her f/u appt before I can refill med

## 2016-08-30 ENCOUNTER — Encounter: Payer: Self-pay | Admitting: Family Medicine

## 2016-08-30 ENCOUNTER — Ambulatory Visit (INDEPENDENT_AMBULATORY_CARE_PROVIDER_SITE_OTHER): Payer: BLUE CROSS/BLUE SHIELD | Admitting: Family Medicine

## 2016-08-30 VITALS — BP 104/60 | HR 68 | Temp 98.0°F | Ht 66.0 in | Wt 191.0 lb

## 2016-08-30 DIAGNOSIS — E038 Other specified hypothyroidism: Secondary | ICD-10-CM | POA: Diagnosis not present

## 2016-08-30 DIAGNOSIS — D509 Iron deficiency anemia, unspecified: Secondary | ICD-10-CM | POA: Diagnosis not present

## 2016-08-30 NOTE — Patient Instructions (Addendum)
Thyroid test today  We will also check your blood count for anemia (and also your white blood cell count was elevated and this will re check it)  Take care of yourself  Work on a balanced diet

## 2016-08-30 NOTE — Progress Notes (Signed)
Subjective:    Patient ID: Robin Pollard, female    DOB: 1991-01-22, 25 y.o.   MRN: [id number]  HPI Here for f/u of chronic health problems   Wt Readings from Last 3 Encounters:  08/30/16 191 lb (86.6 kg)  07/25/16 213 lb (96.6 kg)  08/26/15 163 lb (73.9 kg)  doing well with weight loss post baby  Bad about fast food  Will be able to exercise at 6 weeks (likes to run)  bmi is 30.8  Flu vaccine - had it   Pap is upcoming next Tuesday for her pp pap and exam  Last pap was w/in the year and it was normal    Had a baby in oct -it went well / had a girl  Doing well    Hypothyroidism -no changes when pregnant  Pt has no clinical changes No change in energy level/ hair or skin/ edema and no tremor  (just tired from recent delivery) Lab Results  Component Value Date   TSH 0.76 08/26/2015    Hx of Hashimotos thyroiditis with a goiter  Last check -TSH was off a bit  No change in her goiter    Hx of migraines -really has not had any lately - minor headaches during stress only   Anemic after delivery Lab Results  Component Value Date   WBC 14.2 (H) 07/27/2016   HGB 10.9 (L) 07/27/2016   HCT 31.8 (L) 07/27/2016   MCV 88.8 07/27/2016   PLT 137 (L) 07/27/2016     BP Readings from Last 3 Encounters:  08/30/16 104/60  07/28/16 112/63  08/26/15 114/70     Patient Active Problem List   Diagnosis Date Noted  . Normal labor 07/26/2016  . SVD (spontaneous vaginal delivery) 07/26/2016  . Screening for lipoid disorders 08/26/2015  . Classic migraine 08/14/2015  . Pre-employment examination 04/11/2011  . Hypothyroidism 11/13/2009  . Goiter 05/12/2008  . HUMAN PAPILLOMAVIRUS 05/11/2007  . ABFND PAP SMEAR LGSIL 05/11/2007   Past Medical History:  Diagnosis Date  . Classic migraine 08/14/2015  . Goiter    Hashimoto's thyroiditis  . H/O varicella   . HPV (human papilloma virus) anogenital infection    not high risk  . Hypothyroidism   . Oral herpes   . SVD  (spontaneous vaginal delivery)    X 1   Past Surgical History:  Procedure Laterality Date  . CYSTECTOMY     dermoid cyst  . DILATION AND EVACUATION N/A 02/12/2015   Procedure: DILATATION AND EVACUATION WITH ULTRASOUND GUIDANCE;  Surgeon: Marcelle OverlieMichelle Grewal, MD;  Location: [facility];  Service: Gynecology;  Laterality: N/A;  . OOPHORECTOMY  2001   left  . teratoma removal    . WISDOM TOOTH EXTRACTION     Social History  Substance Use Topics  . Smoking status: Never Smoker  . Smokeless tobacco: Never Used  . Alcohol use No   Family History  Problem Relation Age of Onset  . Urolithiasis Sister   . Hypertension Mother   . Evelene CroonWolff Parkinson White syndrome Mother   . Urolithiasis Mother   . Cancer Mother     breast also melanoma  . Heart disease Maternal Grandmother     CAD  . Diabetes Maternal Grandfather   . Heart disease Paternal Grandfather     CAD  . Hypertension Paternal Grandfather   . Diabetes Paternal Grandfather   . Hypertension Paternal Grandmother   . Diabetes Paternal Grandmother   . Heart disease Paternal Grandmother   .  Migraines Neg Hx    No Known Allergies Current Outpatient Prescriptions on File Prior to Visit  Medication Sig Dispense Refill  . levothyroxine (SYNTHROID) 50 MCG tablet Take 1 tablet (50 mcg total) by mouth daily. 90 tablet 3  . prenatal vitamin w/FE, FA (PRENATAL 1 + 1) 27-1 MG TABS tablet Take 1 tablet by mouth daily.     . SUMAtriptan (IMITREX) 100 MG tablet Take 1 tablet (100 mg total) by mouth 2 (two) times daily as needed for migraine. 10 tablet 2  . acetaminophen (TYLENOL) 500 MG tablet Take 1,000 mg by mouth every 6 (six) hours as needed for mild pain, moderate pain or headache.    . ibuprofen (ADVIL,MOTRIN) 600 MG tablet Take 1 tablet (600 mg total) by mouth every 6 (six) hours. (Patient not taking: Reported on 08/30/2016) 30 tablet 1   No current facility-administered medications on file prior to visit.     Review of Systems Review of  Systems  Constitutional: Negative for fever, appetite change,  and unexpected weight change.  Eyes: Negative for pain and visual disturbance.  Respiratory: Negative for cough and shortness of breath.   Cardiovascular: Negative for cp or palpitations    Gastrointestinal: Negative for nausea, diarrhea and constipation.  Genitourinary: Negative for urgency and frequency.  Skin: Negative for pallor or rash   Neurological: Negative for weakness, light-headedness, numbness and headaches.  Hematological: Negative for adenopathy. Does not bruise/bleed easily.  Psychiatric/Behavioral: Negative for dysphoric mood. The patient is not nervous/anxious.         Objective:   Physical Exam  Constitutional: She appears well-developed and well-nourished. No distress.  overwt and well app  HENT:  Head: Normocephalic and atraumatic.  Mouth/Throat: Oropharynx is clear and moist.  Eyes: Conjunctivae and EOM are normal. Pupils are equal, round, and reactive to light.  Neck: Normal range of motion. Neck supple. No JVD present. Carotid bruit is not present. Thyromegaly present.  No change in symmetric thyroid enl  Cardiovascular: Normal rate, regular rhythm, normal heart sounds and intact distal pulses.  Exam reveals no gallop.   Pulmonary/Chest: Effort normal and breath sounds normal. No respiratory distress. She has no wheezes. She has no rales.  No crackles  Abdominal: She exhibits no abdominal bruit.  Musculoskeletal: She exhibits no edema.  Lymphadenopathy:    She has no cervical adenopathy.  Neurological: She is alert. She has normal reflexes. She displays no tremor. No cranial nerve deficit. She exhibits normal muscle tone. Coordination normal.  Skin: Skin is warm and dry. No rash noted. No pallor.  Psychiatric: She has a normal mood and affect.          Assessment & Plan:   Problem List Items Addressed This Visit      Endocrine   Hypothyroidism - Primary    No clinical changes  S/p  pregnancy  TSH today  Adj dose if necessary      Relevant Orders   TSH     Other   Anemia, iron deficiency    S/p pregnancy and delivery in oct  Re check today  Also noted that her wbc was elevated in the hospital  She may have a high nl count anyway  Re check cbc tocay      Relevant Orders   CBC with Differential/Platelet

## 2016-08-30 NOTE — Progress Notes (Signed)
Pre visit review using our clinic review tool, if applicable. No additional management support is needed unless otherwise documented below in the visit note. 

## 2016-08-31 LAB — CBC WITH DIFFERENTIAL/PLATELET
BASOS PCT: 0.4 % (ref 0.0–3.0)
Basophils Absolute: 0 10*3/uL (ref 0.0–0.1)
EOS PCT: 3.1 % (ref 0.0–5.0)
Eosinophils Absolute: 0.2 10*3/uL (ref 0.0–0.7)
HEMATOCRIT: 41.2 % (ref 36.0–46.0)
Hemoglobin: 13.8 g/dL (ref 12.0–15.0)
LYMPHS PCT: 30.7 % (ref 12.0–46.0)
Lymphs Abs: 2 10*3/uL (ref 0.7–4.0)
MCHC: 33.5 g/dL (ref 30.0–36.0)
MCV: 88.2 fl (ref 78.0–100.0)
MONOS PCT: 6.5 % (ref 3.0–12.0)
Monocytes Absolute: 0.4 10*3/uL (ref 0.1–1.0)
NEUTROS ABS: 3.8 10*3/uL (ref 1.4–7.7)
Neutrophils Relative %: 59.3 % (ref 43.0–77.0)
PLATELETS: 185 10*3/uL (ref 150.0–400.0)
RBC: 4.67 Mil/uL (ref 3.87–5.11)
RDW: 13.1 % (ref 11.5–15.5)
WBC: 6.5 10*3/uL (ref 4.0–10.5)

## 2016-08-31 LAB — TSH: TSH: 0.77 u[IU]/mL (ref 0.35–4.50)

## 2016-08-31 NOTE — Assessment & Plan Note (Signed)
S/p pregnancy and delivery in oct  Re check today  Also noted that her wbc was elevated in the hospital  She may have a high nl count anyway  Re check cbc tocay

## 2016-08-31 NOTE — Assessment & Plan Note (Signed)
No clinical changes  S/p pregnancy  TSH today  Adj dose if necessary

## 2016-09-01 NOTE — Telephone Encounter (Signed)
Med refilled.

## 2016-09-06 DIAGNOSIS — Z1389 Encounter for screening for other disorder: Secondary | ICD-10-CM | POA: Diagnosis not present

## 2016-12-29 ENCOUNTER — Encounter: Payer: Self-pay | Admitting: Family Medicine

## 2016-12-29 ENCOUNTER — Ambulatory Visit (INDEPENDENT_AMBULATORY_CARE_PROVIDER_SITE_OTHER): Payer: BLUE CROSS/BLUE SHIELD | Admitting: Family Medicine

## 2016-12-29 VITALS — BP 90/64 | HR 78 | Temp 98.5°F | Ht 66.0 in | Wt 163.5 lb

## 2016-12-29 DIAGNOSIS — Q759 Congenital malformation of skull and face bones, unspecified: Secondary | ICD-10-CM

## 2016-12-29 NOTE — Progress Notes (Signed)
Pre visit review using our clinic review tool, if applicable. No additional management support is needed unless otherwise documented below in the visit note. 

## 2016-12-29 NOTE — Progress Notes (Signed)
Dr. Karleen HampshireSpencer T. Demetrus Pavao, MD, CAQ [facility] [address] [place], [id number] Phone: [phone] Fax: [phone]  12/29/2016  Patient: Robin Pollard, MRN: [id number], DOB: 1991-02-13, 26 y.o.  Primary Physician:  Roxy MannsMarne Tower, MD   Chief Complaint  Patient presents with  . Dent in Skull    Right Side-Feels like it is getting bigger   Subjective:   Robin Pollard is a 26 y.o. 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   She has had this for many years.  She is unclear if she has had this since childhood.  It doesn't hurt at all.  On the right side of her head, somewhat posteriorly there is an indentation in her skull.  Past Medical History, Surgical History, Social History, Family History, Problem List, Medications, and Allergies have been reviewed and updated if relevant.  Patient Active Problem List   Diagnosis Date Noted  . Anemia, iron deficiency 08/30/2016  . Normal labor 07/26/2016  . SVD (spontaneous vaginal delivery) 07/26/2016  . Screening for lipoid disorders 08/26/2015  . Classic migraine 08/14/2015  . Pre-employment examination 04/11/2011  . Hypothyroidism 11/13/2009  . Goiter 05/12/2008  . HUMAN PAPILLOMAVIRUS 05/11/2007  . ABFND PAP SMEAR LGSIL 05/11/2007    Past Medical History:  Diagnosis Date  . Classic migraine 08/14/2015  . Goiter    Hashimoto's thyroiditis  . H/O varicella   . HPV (human papilloma virus) anogenital infection    not high risk  . Hypothyroidism   . Oral herpes   . SVD (spontaneous vaginal delivery)    X 1    Past Surgical History:  Procedure Laterality Date  . CYSTECTOMY     dermoid cyst  . DILATION AND EVACUATION N/A 02/12/2015   Procedure: DILATATION AND EVACUATION WITH ULTRASOUND GUIDANCE;  Surgeon: Marcelle OverlieMichelle Grewal, MD;  Location: [facility];  Service: Gynecology;  Laterality: N/A;  . OOPHORECTOMY  2001   left  . teratoma  removal    . WISDOM TOOTH EXTRACTION      Social History   Social History  . Marital status: Married    Spouse name: Loraine LericheMark  . Number of children: 1  . Years of education: MA   Occupational History  .  Other    [employer]   Social History Main Topics  . Smoking status: Never Smoker  . Smokeless tobacco: Never Used  . Alcohol use No  . Drug use: No  . Sexual activity: Yes    Birth control/ protection: None   Other Topics Concern  . Not on Pollard   Social History Narrative  . No narrative on Pollard    Family History  Problem Relation Age of Onset  . Urolithiasis Sister   . Hypertension Mother   . Evelene CroonWolff Parkinson White syndrome Mother   . Urolithiasis Mother   . Cancer Mother     breast also melanoma  . Heart disease Maternal Grandmother     CAD  . Diabetes Maternal Grandfather   . Heart disease Paternal Grandfather     CAD  . Hypertension Paternal Grandfather   . Diabetes Paternal Grandfather   . Hypertension Paternal Grandmother   . Diabetes Paternal Grandmother   . Heart disease Paternal Grandmother   . Migraines Neg Hx     No Known Allergies  Medication list reviewed and updated in full in [facility].   GEN: No acute  illnesses, no fevers, chills. GI: No n/v/d, eating normally Pulm: No SOB Interactive and getting along well at home.  Otherwise, ROS is as per the HPI.  Objective:   BP 90/64   Pulse 78   Temp 98.5 F (36.9 C) (Oral)   Ht 5\' 6"  (1.676 m)   Wt 163 lb 8 oz (74.2 kg)   LMP 12/12/2016   Breastfeeding? Yes   BMI 26.39 kg/m   GEN: WDWN, NAD, Non-toxic, A & O x 3 HEENT: Atraumatic, Normocephalic. Neck supple. No masses, No LAD. Ears and Nose: No external deformity. EXTR: No c/c/e NEURO Normal gait.  PSYCH: Normally interactive. Conversant. Not depressed or anxious appearing.  Calm demeanor.   Skull: Normal-appearing skull with palpation throughout and indentation approximately 3 or 4 cm across mildly with clearly  present bone in this indentation.  This is present on the right aspect of the skull slightly posteriorly.  Laboratory and Imaging Data:  Assessment and Plan:   Skull asymmetry  Reassurance.  My suspicion is that this is probably been present since early childhood and development.  Follow-up: No Follow-up on Pollard.  Signed,  Elpidio Galea. Aidric Endicott, MD   Allergies as of 12/29/2016   No Known Allergies     Medication List       Accurate as of 12/29/16 11:59 PM. Always use your most recent med list.          acetaminophen 500 MG tablet Commonly known as:  TYLENOL Take 1,000 mg by mouth every 6 (six) hours as needed for mild pain, moderate pain or headache.   ibuprofen 600 MG tablet Commonly known as:  ADVIL,MOTRIN Take 1 tablet (600 mg total) by mouth every 6 (six) hours.   prenatal vitamin w/FE, FA 27-1 MG Tabs tablet Take 1 tablet by mouth daily.   SUMAtriptan 100 MG tablet Commonly known as:  IMITREX Take 1 tablet (100 mg total) by mouth 2 (two) times daily as needed for migraine.   SYNTHROID 50 MCG tablet Generic drug:  levothyroxine TAKE 1 TABLET (50 MCG TOTAL) BY MOUTH DAILY.

## 2017-08-08 ENCOUNTER — Encounter: Payer: Self-pay | Admitting: Family Medicine

## 2017-08-08 ENCOUNTER — Ambulatory Visit: Payer: BLUE CROSS/BLUE SHIELD | Admitting: Family Medicine

## 2017-08-08 VITALS — BP 114/60 | HR 86 | Temp 97.9°F | Ht 66.0 in | Wt 153.4 lb

## 2017-08-08 DIAGNOSIS — J189 Pneumonia, unspecified organism: Secondary | ICD-10-CM

## 2017-08-08 MED ORDER — AZITHROMYCIN 250 MG PO TABS: ORAL_TABLET | ORAL | 0 refills | Status: DC

## 2017-08-08 NOTE — Progress Notes (Signed)
SUBJECTIVE:  Robin Pollard is a 26 y.o. female who complains of coryza, congestion and productive cough for 21 days. She denies a history of anorexia and chest pain and denies a history of asthma. Patient denies smoke cigarettes.   Current Outpatient Medications on File Prior to Visit  Medication Sig Dispense Refill  . acetaminophen (TYLENOL) 500 MG tablet Take 1,000 mg by mouth every 6 (six) hours as needed for mild pain, moderate pain or headache.    . ibuprofen (ADVIL,MOTRIN) 600 MG tablet Take 1 tablet (600 mg total) by mouth every 6 (six) hours. 30 tablet 1  . SYNTHROID 50 MCG tablet TAKE 1 TABLET (50 MCG TOTAL) BY MOUTH DAILY. 90 tablet 3  . SUMAtriptan (IMITREX) 100 MG tablet Take 1 tablet (100 mg total) by mouth 2 (two) times daily as needed for migraine. (Patient not taking: Reported on 08/08/2017) 10 tablet 2   No current facility-administered medications on file prior to visit.     No Known Allergies  Past Medical History:  Diagnosis Date  . Classic migraine 08/14/2015  . Goiter    Hashimoto's thyroiditis  . H/O varicella   . HPV (human papilloma virus) anogenital infection    not high risk  . Hypothyroidism   . Oral herpes   . SVD (spontaneous vaginal delivery)    X 1    Past Surgical History:  Procedure Laterality Date  . CYSTECTOMY     dermoid cyst  . OOPHORECTOMY  2001   left  . teratoma removal    . WISDOM TOOTH EXTRACTION      Family History  Problem Relation Age of Onset  . Urolithiasis Sister   . Hypertension Mother   . Evelene CroonWolff Parkinson White syndrome Mother   . Urolithiasis Mother   . Cancer Mother        breast also melanoma  . Heart disease Maternal Grandmother        CAD  . Diabetes Maternal Grandfather   . Heart disease Paternal Grandfather        CAD  . Hypertension Paternal Grandfather   . Diabetes Paternal Grandfather   . Hypertension Paternal Grandmother   . Diabetes Paternal Grandmother   . Heart disease Paternal Grandmother   .  Migraines Neg Hx     Social History   Socioeconomic History  . Marital status: Married    Spouse name: Loraine LericheMark  . Number of children: 1  . Years of education: MA  . Highest education level: Not on file  Social Needs  . Financial resource strain: Not on file  . Food insecurity - worry: Not on file  . Food insecurity - inability: Not on file  . Transportation needs - medical: Not on file  . Transportation needs - non-medical: Not on file  Occupational History    Employer: OTHER    Comment: [employer]  Tobacco Use  . Smoking status: Never Smoker  . Smokeless tobacco: Never Used  Substance and Sexual Activity  . Alcohol use: No    Alcohol/week: 0.0 oz  . Drug use: No  . Sexual activity: Yes    Birth control/protection: None  Other Topics Concern  . Not on file  Social History Narrative  . Not on file   The PMH, PSH, Social History, Family History, Medications, and allergies have been reviewed in [facility], and have been updated if relevant.  OBJECTIVE: BP 114/60 (BP Location: Left Arm, Patient Position: Sitting, Cuff Size: Normal)   Pulse 86  Temp 97.9 F (36.6 C) (Oral)   Ht 5\' 6"  (1.676 m)   Wt 153 lb 6.4 oz (69.6 kg)   LMP 07/24/2017   SpO2 98%   BMI 24.76 kg/m   She appears well, vital signs are as noted. Ears normal.  Throat and pharynx normal.  Neck supple. No adenopathy in the neck. Nose is congested. Sinuses non tender. The chest is clear, without wheezes or rales.  ASSESSMENT:  ? Walking PNA given persistence of symptoms  PLAN: zpack Symptomatic therapy suggested: push fluids, rest and return office visit prn if symptoms persist or worsen. Call or return to clinic prn if these symptoms worsen or fail to improve as anticipated.

## 2017-08-23 ENCOUNTER — Other Ambulatory Visit: Payer: Self-pay | Admitting: Family Medicine

## 2017-08-23 NOTE — Telephone Encounter (Signed)
Please schedule winter f/u and refill until then 

## 2017-08-23 NOTE — Telephone Encounter (Signed)
Med refilled (due to holiday), left voicemail requesting pt to call the office back

## 2017-08-23 NOTE — Telephone Encounter (Signed)
No recent or future f/u, pt did have a recent acute appt with another provider and no recent labs, please advise

## 2017-08-31 NOTE — Telephone Encounter (Signed)
Left 2nd voicemail requesting pt to call the office back and schedule a med refill f/u appt

## 2017-09-01 NOTE — Telephone Encounter (Signed)
Pt called back and appt was scheduled 

## 2017-10-04 ENCOUNTER — Ambulatory Visit: Payer: BLUE CROSS/BLUE SHIELD | Admitting: Family Medicine

## 2017-10-04 DIAGNOSIS — Z0289 Encounter for other administrative examinations: Secondary | ICD-10-CM

## 2017-11-08 ENCOUNTER — Ambulatory Visit: Payer: BLUE CROSS/BLUE SHIELD | Admitting: Family Medicine

## 2017-11-08 DIAGNOSIS — Z0289 Encounter for other administrative examinations: Secondary | ICD-10-CM

## 2017-11-29 ENCOUNTER — Other Ambulatory Visit: Payer: Self-pay | Admitting: Family Medicine

## 2017-12-04 ENCOUNTER — Telehealth: Payer: Self-pay | Admitting: Family Medicine

## 2017-12-04 MED ORDER — LEVOTHYROXINE SODIUM 50 MCG PO TABS: 50.0000 ug | ORAL_TABLET | Freq: Every day | ORAL | 0 refills | Status: DC

## 2017-12-04 NOTE — Telephone Encounter (Signed)
Copied from CRM [phone]. Topic: Inquiry >> Dec 04, 2017  9:19 AM Yvonna Alanisobinson, Andra M wrote: Reason for CRM: Patient called requesting a refill of SYNTHROID 50 MCG tablet. Patient states that she is almost completely out. Patient's preferred pharmacy is  [facility] [phone][id number]- [place] - [address] [phone] (Phone)    [phone] (Fax).

## 2017-12-04 NOTE — Telephone Encounter (Signed)
Pt scheduled appt so med refilled 

## 2017-12-04 NOTE — Telephone Encounter (Signed)
Copied from CRM #[id number]. Topic: Inquiry °>> Dec 04, 2017  9:19 AM Robinson, Andra M wrote: °Reason for CRM: Patient called requesting a refill of SYNTHROID 50 MCG tablet. Patient states that she is almost completely out. Patient's preferred pharmacy is  °[id number] [place], [place] - [address] [phone] (Phone)    [phone] (Fax).  ° ° °

## 2017-12-16 DIAGNOSIS — J029 Acute pharyngitis, unspecified: Secondary | ICD-10-CM | POA: Diagnosis not present

## 2017-12-16 DIAGNOSIS — J309 Allergic rhinitis, unspecified: Secondary | ICD-10-CM | POA: Diagnosis not present

## 2017-12-18 ENCOUNTER — Encounter: Payer: Self-pay | Admitting: Family Medicine

## 2017-12-18 ENCOUNTER — Ambulatory Visit: Payer: BLUE CROSS/BLUE SHIELD | Admitting: Family Medicine

## 2017-12-18 VITALS — BP 104/62 | HR 64 | Temp 98.0°F | Ht 66.0 in | Wt 153.8 lb

## 2017-12-18 DIAGNOSIS — Z23 Encounter for immunization: Secondary | ICD-10-CM

## 2017-12-18 DIAGNOSIS — E038 Other specified hypothyroidism: Secondary | ICD-10-CM

## 2017-12-18 DIAGNOSIS — F430 Acute stress reaction: Secondary | ICD-10-CM | POA: Diagnosis not present

## 2017-12-18 DIAGNOSIS — Z13220 Encounter for screening for lipoid disorders: Secondary | ICD-10-CM | POA: Diagnosis not present

## 2017-12-18 DIAGNOSIS — E049 Nontoxic goiter, unspecified: Secondary | ICD-10-CM

## 2017-12-18 LAB — LIPID PANEL
CHOL/HDL RATIO: 2
CHOLESTEROL: 145 mg/dL (ref 0–200)
HDL: 58.9 mg/dL (ref >39.00)
LDL CALC: 75 mg/dL (ref 0–99)
NonHDL: 86.16
TRIGLYCERIDES: 58 mg/dL (ref 0.0–149.0)
VLDL: 11.6 mg/dL (ref 0.0–40.0)

## 2017-12-18 LAB — TSH: TSH: 0.88 u[IU]/mL (ref 0.35–4.50)

## 2017-12-18 NOTE — Assessment & Plan Note (Signed)
More anxious lately  Reviewed stressors/ coping techniques/symptoms/ support sources/ tx options and side effects in detail today Disc reducing stressors Disc opt of CBT  Disc opt of meditation and how to do it  Consider meds if this worsens Self care importance stressed- she needs help with balance /esp at work

## 2017-12-18 NOTE — Progress Notes (Signed)
Subjective:    Patient ID: Robin Pollard, female    DOB: 06/18/1991, 27 y.o.   MRN: [id number]  HPI Here for f/u of chronic medical problems  Wt Readings from Last 3 Encounters:  12/18/17 153 lb 12 oz (69.7 kg)  08/08/17 153 lb 6.4 oz (69.6 kg)  12/29/16 163 lb 8 oz (74.2 kg)   24.82 kg/m  Significant wt loss since last pregnancy -working on it   She does exercise  Was going to [employer] ([employer]) but gave up due to time  Does different things   BP Readings from Last 3 Encounters:  12/18/17 104/62  08/08/17 114/60  12/29/16 90/64   Pulse Readings from Last 3 Encounters:  12/18/17 64  08/08/17 86  12/29/16 78    Hypothyroid with hx of goiter/Hashimotos Is more anxious lately  Her libido is lower  No other changes  Lab Results  Component Value Date   TSH 0.77 08/30/2016   due for labs   H/o migraine  Last imitrex px is from Dr Anne Hahn  Has not had a lot of them- occ minor headaches  Overall a lot better !   Had to go to UC on sat  Was diag with viral uri /congestion   Flu vaccine - will do today   Gyn care - due for that   Struggling with mood / her PHQ-9 score is 5  Can never relax  She is overwhelmed - too much going on with kids/work and in the midst of moving ([place]) Nothing can be changed  Cannot get help at work [employer]) --may need to change school/job  Older child is in school / and little one in day care  Brain races all the time  Gets irritable/ tired    Would like cholesterol screening  Diet is good   Patient Active Problem List   Diagnosis Date Noted  . Stress reaction 12/18/2017  . Anemia, iron deficiency 08/30/2016  . Normal labor 07/26/2016  . SVD (spontaneous vaginal delivery) 07/26/2016  . Screening for lipoid disorders 08/26/2015  . Classic migraine 08/14/2015  . Pre-employment examination 04/11/2011  . Hypothyroidism 11/13/2009  . Goiter 05/12/2008  . HUMAN PAPILLOMAVIRUS 05/11/2007  . ABFND PAP SMEAR LGSIL  05/11/2007   Past Medical History:  Diagnosis Date  . Classic migraine 08/14/2015  . Goiter    Hashimoto's thyroiditis  . H/O varicella   . HPV (human papilloma virus) anogenital infection    not high risk  . Hypothyroidism   . Oral herpes   . SVD (spontaneous vaginal delivery)    X 1   Past Surgical History:  Procedure Laterality Date  . CYSTECTOMY     dermoid cyst  . DILATION AND EVACUATION N/A 02/12/2015   Procedure: DILATATION AND EVACUATION WITH ULTRASOUND GUIDANCE;  Surgeon: Marcelle Overlie, MD;  Location: [facility];  Service: Gynecology;  Laterality: N/A;  . OOPHORECTOMY  2001   left  . teratoma removal    . WISDOM TOOTH EXTRACTION     Social History   Tobacco Use  . Smoking status: Never Smoker  . Smokeless tobacco: Never Used  Substance Use Topics  . Alcohol use: No    Alcohol/week: 0.0 oz  . Drug use: No   Family History  Problem Relation Age of Onset  . Urolithiasis Sister   . Hypertension Mother   . Evelene Croon Parkinson White syndrome Mother   . Urolithiasis Mother   . Cancer Mother  breast also melanoma  . Heart disease Maternal Grandmother        CAD  . Diabetes Maternal Grandfather   . Heart disease Paternal Grandfather        CAD  . Hypertension Paternal Grandfather   . Diabetes Paternal Grandfather   . Hypertension Paternal Grandmother   . Diabetes Paternal Grandmother   . Heart disease Paternal Grandmother   . Migraines Neg Hx    No Known Allergies Current Outpatient Medications on File Prior to Visit  Medication Sig Dispense Refill  . acetaminophen (TYLENOL) 500 MG tablet Take 1,000 mg by mouth every 6 (six) hours as needed for mild pain, moderate pain or headache.    . ibuprofen (ADVIL,MOTRIN) 600 MG tablet Take 1 tablet (600 mg total) by mouth every 6 (six) hours. 30 tablet 1  . levothyroxine (SYNTHROID) 50 MCG tablet Take 1 tablet (50 mcg total) by mouth daily. 30 tablet 0  . SUMAtriptan (IMITREX) 100 MG tablet Take 1 tablet (100 mg  total) by mouth 2 (two) times daily as needed for migraine. 10 tablet 2   No current facility-administered medications on file prior to visit.     Review of Systems  Constitutional: Negative for activity change, appetite change, fatigue, fever and unexpected weight change.  HENT: Negative for congestion, ear pain, rhinorrhea, sinus pressure and sore throat.   Eyes: Negative for pain, redness and visual disturbance.  Respiratory: Negative for cough, shortness of breath and wheezing.   Cardiovascular: Negative for chest pain and palpitations.  Gastrointestinal: Negative for abdominal pain, blood in stool, constipation and diarrhea.  Endocrine: Negative for polydipsia and polyuria.  Genitourinary: Negative for dysuria, frequency and urgency.  Musculoskeletal: Negative for arthralgias, back pain and myalgias.  Skin: Negative for pallor and rash.  Allergic/Immunologic: Negative for environmental allergies.  Neurological: Negative for dizziness, syncope and headaches.  Hematological: Negative for adenopathy. Does not bruise/bleed easily.  Psychiatric/Behavioral: Positive for sleep disturbance. Negative for decreased concentration, dysphoric mood, self-injury and suicidal ideas. The patient is nervous/anxious.        Objective:   Physical Exam  Constitutional: She appears well-developed and well-nourished. No distress.  Well appearing  Wt loss noted   HENT:  Head: Normocephalic and atraumatic.  Mouth/Throat: Oropharynx is clear and moist.  Eyes: Conjunctivae and EOM are normal. Pupils are equal, round, and reactive to light.  Neck: Normal range of motion. Neck supple. No JVD present. Carotid bruit is not present. Thyromegaly present.  No change in symmetric enl thyroid- non tender No bruits   Cardiovascular: Normal rate, regular rhythm, normal heart sounds and intact distal pulses. Exam reveals no gallop.  Pulmonary/Chest: Effort normal and breath sounds normal. No respiratory distress.  She has no wheezes. She has no rales.  No crackles  Abdominal: Soft. Bowel sounds are normal. She exhibits no distension, no abdominal bruit and no mass. There is no tenderness.  Musculoskeletal: She exhibits no edema.  Lymphadenopathy:    She has no cervical adenopathy.  Neurological: She is alert. She has normal reflexes. She displays no tremor. No cranial nerve deficit. She exhibits normal muscle tone. Coordination normal.  Skin: Skin is warm and dry. No rash noted. No pallor.  Psychiatric: Her speech is normal and behavior is normal. Thought content normal. Her mood appears anxious. Her affect is not angry, not blunt, not labile and not inappropriate. Thought content is not paranoid. She does not exhibit a depressed mood. She expresses no homicidal ideation.  Candid when talking about stressors  and symptoms           Assessment & Plan:   Problem List Items Addressed This Visit      Endocrine   Goiter    No clinical changes TSH today      Hypothyroidism - Primary    TSH today  More anxious lately  Otherwise no clinical changes (except intentional wt loss)      Relevant Orders   TSH (Completed)     Other   Screening for lipoid disorders    With good diet  Lab today      Relevant Orders   Lipid panel (Completed)   Stress reaction    More anxious lately  Reviewed stressors/ coping techniques/symptoms/ support sources/ tx options and side effects in detail today Disc reducing stressors Disc opt of CBT  Disc opt of meditation and how to do it  Consider meds if this worsens Self care importance stressed- she needs help with balance /esp at work        Other Visit Diagnoses    Need for influenza vaccination       Relevant Orders   Flu Vaccine QUAD 6+ mos PF IM (Fluarix Quad PF) (Completed)

## 2017-12-18 NOTE — Assessment & Plan Note (Signed)
TSH today  More anxious lately  Otherwise no clinical changes (except intentional wt loss)

## 2017-12-18 NOTE — Patient Instructions (Addendum)
Meditation and mindfulness practice may really help you  There is a free app called "Smiling mind"   Also - cognitive behavioral counseling helps  If interested in that -let us know!  If you can change any situational issues- that is step one   You are due for your gyn- call to make an appointment   Flu shot today

## 2017-12-18 NOTE — Assessment & Plan Note (Signed)
With good diet  Lab today

## 2017-12-18 NOTE — Assessment & Plan Note (Signed)
No clinical changes TSH today 

## 2017-12-19 ENCOUNTER — Telehealth: Payer: Self-pay | Admitting: *Deleted

## 2017-12-19 ENCOUNTER — Encounter: Payer: Self-pay | Admitting: *Deleted

## 2017-12-19 MED ORDER — LEVOTHYROXINE SODIUM 50 MCG PO TABS: 50.0000 ug | ORAL_TABLET | Freq: Every day | ORAL | 5 refills | Status: DC

## 2017-12-19 NOTE — Telephone Encounter (Signed)
-----   Message from Judy PimpleMarne A Tower, MD sent at 12/18/2017  8:36 PM EDT ----- Cholesterol is good -improved No change in thyroid dose needed (please refill it needed)

## 2018-06-29 ENCOUNTER — Other Ambulatory Visit: Payer: Self-pay | Admitting: Family Medicine

## 2018-09-04 ENCOUNTER — Encounter: Payer: Self-pay | Admitting: Family Medicine

## 2018-09-04 ENCOUNTER — Ambulatory Visit: Payer: BLUE CROSS/BLUE SHIELD | Admitting: Family Medicine

## 2018-09-04 VITALS — BP 114/76 | HR 67 | Temp 98.6°F | Ht 66.0 in | Wt 167.5 lb

## 2018-09-04 DIAGNOSIS — B354 Tinea corporis: Secondary | ICD-10-CM | POA: Diagnosis not present

## 2018-09-04 MED ORDER — KETOCONAZOLE 2 % EX CREA
1.0000 | TOPICAL_CREAM | Freq: Every day | CUTANEOUS | 1 refills | Status: DC
Start: 2018-09-04 — End: 2019-01-16

## 2018-09-04 NOTE — Progress Notes (Signed)
Subjective:    Patient ID: Robin Pollard, female    DOB: 1991/05/24, 27 y.o.   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 Tinea corporis 09/04/2018  . Stress reaction 12/18/2017  . Anemia, iron deficiency 08/30/2016  . Normal labor 07/26/2016  . SVD (spontaneous vaginal delivery) 07/26/2016  . Screening for lipoid disorders 08/26/2015  . Classic migraine 08/14/2015  . Pre-employment examination 04/11/2011  . Hypothyroidism 11/13/2009  . Goiter 05/12/2008  . ABFND PAP SMEAR LGSIL 05/11/2007   Past Medical History:  Diagnosis Date  . Classic migraine 08/14/2015  . Goiter    Hashimoto's thyroiditis  . H/O varicella   . HPV (human papilloma virus) anogenital infection    not high risk  . Hypothyroidism   . Oral herpes   . SVD (spontaneous vaginal delivery)    X 1   Past Surgical History:  Procedure Laterality Date  . CYSTECTOMY     dermoid cyst  . DILATION AND EVACUATION N/A 02/12/2015   Procedure: DILATATION AND EVACUATION WITH ULTRASOUND GUIDANCE;  Surgeon: Marcelle Overlie, MD;  Location: [facility];  Service: Gynecology;  Laterality: N/A;  . OOPHORECTOMY  2001   left  . teratoma removal    . WISDOM TOOTH EXTRACTION     Social History   Tobacco Use  . Smoking status: Never Smoker  . Smokeless tobacco: Never Used  Substance Use Topics  . Alcohol use: No    Alcohol/week: 0.0 standard drinks  . Drug use: No   Family History  Problem Relation Age of Onset  . Urolithiasis Sister   . Hypertension Mother   . Evelene Croon Parkinson White syndrome Mother   . Urolithiasis Mother   . Cancer Mother        breast also melanoma  . Heart disease Maternal Grandmother        CAD  . Diabetes Maternal  Grandfather   . Heart disease Paternal Grandfather        CAD  . Hypertension Paternal Grandfather   . Diabetes Paternal Grandfather   . Hypertension Paternal Grandmother   . Diabetes Paternal Grandmother   . Heart disease Paternal Grandmother   . Migraines Neg Hx    No Known Allergies Current Outpatient Medications on File Prior to Visit  Medication Sig Dispense Refill  . acetaminophen (TYLENOL) 500 MG tablet Take 1,000 mg by mouth every 6 (six) hours as needed for mild pain, moderate pain or headache.    . ibuprofen (ADVIL,MOTRIN) 600 MG tablet Take 1 tablet (600 mg total) by mouth every 6 (six) hours. 30 tablet 1  . SUMAtriptan (IMITREX) 100 MG tablet Take 1 tablet (100 mg total) by mouth 2 (two) times daily as needed for migraine. 10 tablet 2  . SYNTHROID 50 MCG tablet TAKE 1 TABLET BY MOUTH EVERY DAY 90 tablet 1   No current facility-administered medications on file prior to visit.      Review of Systems  Constitutional: Negative for activity change, appetite change, fatigue, fever and unexpected weight change.  HENT: Negative for congestion, ear pain, rhinorrhea, sinus pressure and sore throat.   Eyes: Negative for pain, redness and visual disturbance.  Respiratory: Negative for cough, shortness of breath and wheezing.   Cardiovascular: Negative for chest pain and palpitations.  Gastrointestinal: Negative for abdominal pain, blood in stool, constipation and diarrhea.  Endocrine: Negative for polydipsia and polyuria.  Genitourinary: Negative for dysuria, frequency and urgency.  Musculoskeletal: Negative for arthralgias, back pain and myalgias.  Skin: Positive for rash. Negative for pallor.  Allergic/Immunologic: Negative for environmental allergies.  Neurological: Negative for dizziness, syncope and headaches.  Hematological: Negative for adenopathy. Does not bruise/bleed easily.  Psychiatric/Behavioral: Negative for decreased concentration and dysphoric mood. The patient is  not nervous/anxious.        Objective:   Physical Exam  Constitutional: She appears well-developed and well-nourished. No distress.  HENT:  Head: Normocephalic and atraumatic.  Mouth/Throat: Oropharynx is clear and moist.  Eyes: Pupils are equal, round, and reactive to light. Conjunctivae and EOM are normal. No scleral icterus.  Neck: Normal range of motion. Neck supple. Thyromegaly present.  Cardiovascular: Normal rate, regular rhythm and normal heart sounds.  Pulmonary/Chest: Effort normal and breath sounds normal. She has no wheezes.  Musculoskeletal: She exhibits no edema.  Lymphadenopathy:    She has no cervical adenopathy.  Neurological: She is alert.  Skin: Skin is warm and dry. Rash noted.  3 by 4 cm area of scalloped figure 8 shaped rash with raised /scaled edges and central clearing  No excoriations No redness  Psychiatric: She has a normal mood and affect.          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

## 2018-09-04 NOTE — Patient Instructions (Signed)
I think this is a dermatophyte (fungal) infection - ringworm Keep clean and dry Use ketoconazole daily  No need to cover if you are wearing sleeves  Update if not starting to improve in 10 days or if worsening

## 2018-09-04 NOTE — Assessment & Plan Note (Signed)
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

## 2019-01-08 ENCOUNTER — Other Ambulatory Visit: Payer: Self-pay | Admitting: Family Medicine

## 2019-01-09 NOTE — Telephone Encounter (Signed)
No f/u in over a year just a recent acute appt, and no TSH level in over a year, ? If pt needs a virtual visit or is it ok to refill

## 2019-01-09 NOTE — Telephone Encounter (Signed)
I left a detailed message on patient's v.m. for her to call back and schedule lab appointment and virtual office visit.

## 2019-01-09 NOTE — Telephone Encounter (Signed)
If she is ok with it, please schedule drive through labs and then a virtual visit  Thanks

## 2019-01-13 ENCOUNTER — Telehealth: Payer: Self-pay | Admitting: Family Medicine

## 2019-01-13 DIAGNOSIS — E039 Hypothyroidism, unspecified: Secondary | ICD-10-CM

## 2019-01-13 NOTE — Telephone Encounter (Signed)
-----   Message from Aquilla Solian, RT sent at 01/10/2019  9:50 AM EDT ----- Regarding: Lab Orders for Monday 4.13.20 Please place lab orders for Monday 4.13.20, Doxy.me visit on Wednesday 4.15.20 Thanks,  Jones Bales RT(R)

## 2019-01-14 ENCOUNTER — Other Ambulatory Visit (INDEPENDENT_AMBULATORY_CARE_PROVIDER_SITE_OTHER): Payer: BLUE CROSS/BLUE SHIELD

## 2019-01-14 DIAGNOSIS — E039 Hypothyroidism, unspecified: Secondary | ICD-10-CM

## 2019-01-14 LAB — TSH: TSH: 1.27 u[IU]/mL (ref 0.35–4.50)

## 2019-01-16 ENCOUNTER — Ambulatory Visit (INDEPENDENT_AMBULATORY_CARE_PROVIDER_SITE_OTHER): Payer: BLUE CROSS/BLUE SHIELD | Admitting: Family Medicine

## 2019-01-16 ENCOUNTER — Encounter: Payer: Self-pay | Admitting: Family Medicine

## 2019-01-16 DIAGNOSIS — E039 Hypothyroidism, unspecified: Secondary | ICD-10-CM | POA: Diagnosis not present

## 2019-01-16 DIAGNOSIS — E049 Nontoxic goiter, unspecified: Secondary | ICD-10-CM | POA: Diagnosis not present

## 2019-01-16 MED ORDER — SYNTHROID 50 MCG PO TABS: 50.0000 ug | ORAL_TABLET | Freq: Every day | ORAL | 11 refills | Status: DC

## 2019-01-16 NOTE — Assessment & Plan Note (Signed)
Hypothyroidism  Pt has no clinical changes No change in energy level/ hair or skin/ edema and no tremor Lab Results  Component Value Date   TSH 1.27 01/14/2019     No issues with mood /anx/depression  0 on the depression screen  inst to continue 50 mcg of synthroid (DAW) daily and disc way to take  Px sent in

## 2019-01-16 NOTE — Progress Notes (Signed)
Virtual Visit via Video Note  I connected with Robin Pollard on 01/16/19 at  9:00 AM EDT by a video enabled telemedicine application and verified that I am speaking with the correct person using two identifiers. The patient is in her home today  I am at my office- [place]    I discussed the limitations of evaluation and management by telemedicine and the availability of in person appointments. The patient expressed understanding and agreed to proceed.  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  28 yo and 28 yo   Works from home   Nothing new health wise  Taking care of herself  Hard not to eat too much at home   Exercises regularly - treadmill [employer]  Runs every day   No change in goiter-no asymmetry or enlargement   Last TSH  Lab Results  Component Value Date   TSH 1.27 01/14/2019   taking 50 mcg of synthroid daily (does DAW-not generic) Takes first thing in am (away from supplements)  occ misses a dose -not often No fatigue  No sleeplessness No jitteriness or tremor  No skin or hair changes     Last pap- was 2017  Was normal  [facility] Has not had time to return   Review of Systems  Constitutional: Negative for chills, diaphoresis, fever, malaise/fatigue and weight loss.  Eyes: Negative for blurred vision.  Respiratory: Negative for cough and wheezing.   Cardiovascular: Negative for chest pain and palpitations.  Musculoskeletal: Negative for myalgias.  Skin: Negative for itching.  Neurological: Negative for dizziness.  Psychiatric/Behavioral: Negative for depression and memory loss. The patient is not nervous/anxious.     Observations/Objective: Pt is well appearing /not distressed  No apparent weight change  Nl mood and speech No tremor  Normal skin color w/o rash  Neck  looks to be stable/ no apparent enlargement of goiter or asymmetry (of course this is assessed by screen so not palpated)    Assessment and Plan: Problem List Items Addressed This Visit      Endocrine   Goiter    Pt denies any changes        Relevant Medications   SYNTHROID 50 MCG tablet   Hypothyroidism - Primary    Hypothyroidism  Pt has no clinical changes No change in energy level/ hair or skin/ edema and no tremor Lab Results  Component Value Date   TSH 1.27 01/14/2019     No issues with mood /anx/depression  0 on the depression screen  inst to continue 50 mcg of synthroid (DAW) daily and disc way to take  Px sent in       Relevant Medications   SYNTHROID 50 MCG tablet       Follow Up Instructions: Continue synthroid at current dose  If any symptoms or problems  Try not to miss doses Continue exercise  Return to gyn for annual exam when covid restrictions are lifted    I discussed the assessment and treatment plan with the patient. The patient was provided an opportunity to ask questions and all were answered. The patient agreed with the plan and demonstrated an understanding of the instructions.   The patient was advised to call back or seek an in-person evaluation if the symptoms worsen or if the  condition fails to improve as anticipated.    Roxy Manns, MD

## 2019-01-16 NOTE — Assessment & Plan Note (Signed)
Pt denies any changes

## 2019-02-22 ENCOUNTER — Telehealth: Payer: Self-pay | Admitting: Family Medicine

## 2019-02-22 NOTE — Telephone Encounter (Signed)
Pt notified copy ready for pick up 

## 2019-02-22 NOTE — Telephone Encounter (Signed)
Best number [phone]  Pt needs copy of her last thyroid labs Please let pt know when ready for pick  Pt is going to try to start my chart so you can put it on there

## 2019-03-18 ENCOUNTER — Encounter: Payer: Self-pay | Admitting: Family Medicine

## 2019-03-18 ENCOUNTER — Other Ambulatory Visit: Payer: Self-pay

## 2019-03-18 ENCOUNTER — Ambulatory Visit (INDEPENDENT_AMBULATORY_CARE_PROVIDER_SITE_OTHER)
Admission: RE | Admit: 2019-03-18 | Discharge: 2019-03-18 | Disposition: A | Discharge status: Home / Self Care | Payer: BC Managed Care – PPO | Source: Ambulatory Visit | Attending: Family Medicine | Admitting: Family Medicine

## 2019-03-18 ENCOUNTER — Ambulatory Visit: Payer: BC Managed Care – PPO | Admitting: Family Medicine

## 2019-03-18 VITALS — BP 108/62 | HR 72 | Temp 98.0°F | Ht 66.0 in | Wt 175.0 lb

## 2019-03-18 DIAGNOSIS — S6992XA Unspecified injury of left wrist, hand and finger(s), initial encounter: Secondary | ICD-10-CM | POA: Insufficient documentation

## 2019-03-18 DIAGNOSIS — M79642 Pain in left hand: Secondary | ICD-10-CM | POA: Diagnosis not present

## 2019-03-18 DIAGNOSIS — Z3200 Encounter for pregnancy test, result unknown: Secondary | ICD-10-CM

## 2019-03-18 LAB — POCT URINE PREGNANCY: Preg Test, Ur: NEGATIVE

## 2019-03-18 NOTE — Progress Notes (Signed)
Subjective:    Patient ID: Robin Pollard, female    DOB: [birth year], 28 y.o.   MRN: [id number]  HPI Here with a finger injury   [place] hand caught behind it between trailer and another one  Ring finger hurts - some bruising  She can move it  She did get an aluminum splint over the counter Used ice for the first several days   Not numb or tingly  Can move it- stiff and sore   Pain is 3/10 right now  Was worse yesterday  Taking ibuprofen   She had a breast augmentation a week ago  Recovery is going well  Was on oxycodone   LMP 5/24 She uses condoms for contraception   Results for orders placed or performed in visit on 03/18/19  POCT urine pregnancy  Result Value Ref Range   Preg Test, Ur Negative Negative    Patient Active Problem List   Diagnosis Date Noted  . Hand injury, left, initial encounter 03/18/2019  . Stress reaction 12/18/2017  . Anemia, iron deficiency 08/30/2016  . SVD (spontaneous vaginal delivery) 07/26/2016  . Screening for lipoid disorders 08/26/2015  . Classic migraine 08/14/2015  . Pre-employment examination 04/11/2011  . Hypothyroidism 11/13/2009  . Goiter 05/12/2008   Past Medical History:  Diagnosis Date  . Classic migraine 08/14/2015  . Goiter    Hashimoto's thyroiditis  . H/O varicella   . HPV (human papilloma virus) anogenital infection    not high risk  . Hypothyroidism   . Oral herpes   . SVD (spontaneous vaginal delivery)    X 1   Past Surgical History:  Procedure Laterality Date  . CYSTECTOMY     dermoid cyst  . DILATION AND EVACUATION N/A 02/12/2015   Procedure: DILATATION AND EVACUATION WITH ULTRASOUND GUIDANCE;  Surgeon: Marcelle OverlieMichelle Grewal, MD;  Location: [facility];  Service: Gynecology;  Laterality: N/A;  . OOPHORECTOMY  2001   left  . teratoma removal    . WISDOM TOOTH EXTRACTION     Social History   Tobacco Use  . Smoking status: Never Smoker  . Smokeless tobacco: Never Used   Substance Use Topics  . Alcohol use: No    Alcohol/week: 0.0 standard drinks  . Drug use: No   Family History  Problem Relation Age of Onset  . Urolithiasis Sister   . Hypertension Mother   . Evelene CroonWolff Parkinson White syndrome Mother   . Urolithiasis Mother   . Cancer Mother        breast also melanoma  . Heart disease Maternal Grandmother        CAD  . Diabetes Maternal Grandfather   . Heart disease Paternal Grandfather        CAD  . Hypertension Paternal Grandfather   . Diabetes Paternal Grandfather   . Hypertension Paternal Grandmother   . Diabetes Paternal Grandmother   . Heart disease Paternal Grandmother   . Migraines Neg Hx    No Known Allergies Current Outpatient Medications on File Prior to Visit  Medication Sig Dispense Refill  . acetaminophen (TYLENOL) 500 MG tablet Take 1,000 mg by mouth every 6 (six) hours as needed for mild pain, moderate pain or headache.    . ibuprofen (ADVIL,MOTRIN) 600 MG tablet Take 1 tablet (600 mg total) by mouth every 6 (six) hours. 30 tablet 1  . oxyCODONE (OXY IR/ROXICODONE) 5 MG immediate release tablet Take 1 tablet by mouth daily as  needed.    Marland Kitchen SYNTHROID 50 MCG tablet Take 1 tablet (50 mcg total) by mouth daily. 90 tablet 11   No current facility-administered medications on file prior to visit.      Review of Systems  Constitutional: Negative for activity change, appetite change, fatigue, fever and unexpected weight change.  HENT: Negative for congestion, ear pain, rhinorrhea, sinus pressure and sore throat.   Eyes: Negative for pain, redness and visual disturbance.  Respiratory: Negative for cough, shortness of breath and wheezing.   Cardiovascular: Negative for chest pain and palpitations.  Gastrointestinal: Negative for abdominal pain, blood in stool, constipation and diarrhea.  Endocrine: Negative for polydipsia and polyuria.  Genitourinary: Negative for dysuria, frequency and urgency.  Musculoskeletal: Negative for  arthralgias, back pain and myalgias.       Pain in L hand- particularly 4th finger  Skin: Negative for pallor and rash.  Allergic/Immunologic: Negative for environmental allergies.  Neurological: Negative for dizziness, syncope and headaches.  Hematological: Negative for adenopathy. Does not bruise/bleed easily.  Psychiatric/Behavioral: Negative for decreased concentration and dysphoric mood. The patient is not nervous/anxious.        Objective:   Physical Exam Constitutional:      General: She is not in acute distress.    Appearance: Normal appearance. She is normal weight. She is not ill-appearing.  HENT:     Mouth/Throat:     Mouth: Mucous membranes are moist.     Pharynx: Oropharynx is clear.  Eyes:     Extraocular Movements: Extraocular movements intact.     Conjunctiva/sclera: Conjunctivae normal.     Pupils: Pupils are equal, round, and reactive to light.  Cardiovascular:     Rate and Rhythm: Normal rate.  Pulmonary:     Effort: Pulmonary effort is normal. No respiratory distress.     Breath sounds: No wheezing.  Musculoskeletal:        General: Swelling, tenderness and signs of injury present. No deformity.     Comments: Left hand  Mild swelling over 3,4th phalanx - and tenderness over middle pip of 4th finger Pain with full flexion of 4th finger  No bony step off or deformity  Nl sensation/ motor strength and perfusion  No crepitus  No MCP tenderness  Nl rom of wrist   Skin:    General: Skin is warm and dry.     Findings: No erythema or rash.  Neurological:     Mental Status: She is alert.     Sensory: No sensory deficit.     Motor: No weakness.  Psychiatric:        Mood and Affect: Mood normal.           Assessment & Plan:   Problem List Items Addressed This Visit      Other   Hand injury, left, initial encounter    Crush injury of L hand involving trailer on Saturday Some swelling/soreness primarily of 4th finger  Has worn otc splint/used  ibuprofen and ice  Xray ordered today (preg test neg)  Adv elevation/ice  If neg-will take off splint (may be getting stiff)       Relevant Orders   DG Hand Complete Left (Completed)    Other Visit Diagnoses    Encounter for pregnancy test, result unknown    -  Primary   Relevant Orders   POCT urine pregnancy (Completed)

## 2019-03-18 NOTE — Assessment & Plan Note (Signed)
Crush injury of L hand involving trailer on Saturday Some swelling/soreness primarily of 4th finger  Has worn otc splint/used ibuprofen and ice  Xray ordered today (preg test neg)  Adv elevation/ice  If neg-will take off splint (may be getting stiff)

## 2019-03-18 NOTE — Patient Instructions (Addendum)
Continue ice and ibuprofen  Elevate hand when you can /this also helps swelling  For now - wear the splint until we get xray results   Let us know if pain or swelling suddenly get worse

## 2019-03-19 ENCOUNTER — Telehealth: Payer: Self-pay | Admitting: Family Medicine

## 2019-03-19 NOTE — Telephone Encounter (Signed)
I sent it  

## 2019-03-19 NOTE — Telephone Encounter (Signed)
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 She would like to know if this letter could be sent VIA my chart instead of her having to come here and pick the letter up    Patient's Best number [phone]

## 2019-03-19 NOTE — Progress Notes (Signed)
Griffey Nicasio T. Blanche Scovell, MD Primary Care and Sports Medicine [facility] [address] [place], [id number] Phone: [phone]  FAX: [phone]  Robin Pollard - 28 y.o. female  MRN [id number]  Date of Birth: Jul 30, 1991  Visit Date: 03/20/2019  PCP: Judy Pimpleower, Marne A, MD  Referred by: Tower, Audrie GallusMarne A, MD  Chief Complaint  Patient presents with  . Hand Injury    Evaluate finger per Dr. Milinda Antisower   Subjective:   Robin MateJena G Caudle is a 28 y.o. very pleasant female patient who presents with the following:  Pleasant young lady who presents with follow-up on a L 4th digit proximal phalanx fracture, mild displacement.   [place] and stuck hand and the trailer between truck and the boat.  Prox 4th fx f/u:  She has extensive bruising around the 4th digits and into the hand. NT in all other portions of hand.  Past Medical History, Surgical History, Social History, Family History, Problem List, Medications, and Allergies have been reviewed and updated if relevant.  Patient Active Problem List   Diagnosis Date Noted  . Hand injury, left, initial encounter 03/18/2019  . Stress reaction 12/18/2017  . Anemia, iron deficiency 08/30/2016  . SVD (spontaneous vaginal delivery) 07/26/2016  . Screening for lipoid disorders 08/26/2015  . Classic migraine 08/14/2015  . Pre-employment examination 04/11/2011  . Hypothyroidism 11/13/2009  . Goiter 05/12/2008    Past Medical History:  Diagnosis Date  . Classic migraine 08/14/2015  . Goiter    Hashimoto's thyroiditis  . H/O varicella   . HPV (human papilloma virus) anogenital infection    not high risk  . Hypothyroidism   . Oral herpes   . SVD (spontaneous vaginal delivery)    X 1    Past Surgical History:  Procedure Laterality Date  . CYSTECTOMY     dermoid cyst  . DILATION AND EVACUATION N/A 02/12/2015   Procedure: DILATATION AND EVACUATION WITH ULTRASOUND GUIDANCE;   Surgeon: Marcelle OverlieMichelle Grewal, MD;  Location: [facility];  Service: Gynecology;  Laterality: N/A;  . OOPHORECTOMY  2001   left  . teratoma removal    . WISDOM TOOTH EXTRACTION      Social History   Socioeconomic History  . Marital status: Married    Spouse name: Loraine LericheMark  . Number of children: 1  . Years of education: MA  . Highest education level: Not on file  Occupational History    Employer: OTHER    Comment: [employer]  Social Needs  . Financial resource strain: Not on file  . Food insecurity    Worry: Not on file    Inability: Not on file  . Transportation needs    Medical: Not on file    Non-medical: Not on file  Tobacco Use  . Smoking status: Never Smoker  . Smokeless tobacco: Never Used  Substance and Sexual Activity  . Alcohol use: No    Alcohol/week: 0.0 standard drinks  . Drug use: No  . Sexual activity: Yes    Birth control/protection: None  Lifestyle  . Physical activity    Days per week: Not on file    Minutes per session: Not on file  . Stress: Not on file  Relationships  . Social Musicianconnections    Talks on phone: Not on file    Gets together: Not on file    Attends religious service: Not on file    Active member of  club or organization: Not on file    Attends meetings of clubs or organizations: Not on file    Relationship status: Not on file  . Intimate partner violence    Fear of current or ex partner: Not on file    Emotionally abused: Not on file    Physically abused: Not on file    Forced sexual activity: Not on file  Other Topics Concern  . Not on file  Social History Narrative  . Not on file    Family History  Problem Relation Age of Onset  . Urolithiasis Sister   . Hypertension Mother   . Evelene CroonWolff Parkinson White syndrome Mother   . Urolithiasis Mother   . Cancer Mother        breast also melanoma  . Heart disease Maternal Grandmother        CAD  . Diabetes Maternal Grandfather   . Heart disease Paternal Grandfather        CAD  .  Hypertension Paternal Grandfather   . Diabetes Paternal Grandfather   . Hypertension Paternal Grandmother   . Diabetes Paternal Grandmother   . Heart disease Paternal Grandmother   . Migraines Neg Hx     No Known Allergies  Medication list reviewed and updated in full in [facility].  GEN: No fevers, chills. Nontoxic. Primarily MSK c/o today. MSK: Detailed in the HPI GI: tolerating PO intake without difficulty Neuro: No numbness, parasthesias, or tingling associated. Otherwise the pertinent positives of the ROS are noted above.   Objective:   BP 102/60   Pulse 83   Temp 98.8 F (37.1 C) (Oral)   Ht 5\' 6"  (1.676 m)   Wt 173 lb 12 oz (78.8 kg)   LMP 02/24/2019 Comment: negative pregnancy test today  BMI 28.04 kg/m    GEN: WDWN, NAD, Non-toxic, Alert & Oriented x 3 HEENT: Atraumatic, Normocephalic.  Ears and Nose: No external deformity. EXTR: No clubbing/cyanosis/edema NEURO: Normal gait.  PSYCH: Normally interactive. Conversant. Not depressed or anxious appearing.  Calm demeanor.    On the left hand, the patient is entirely nontender at the distal radius, distal ulna, and at the elbow.  With full range of motion at the elbow.  She is nontender in all of the carpal region as well as at the metacarpals and at the MCP joints.  Nontender scaphoid.  Nontender in all of the fingers with the exception of the fourth digit.  At the fourth digit she is diffusely tender, markedly tender in the proximal phalanx.  All flexor and extensor tendons are intact in the fourth digit.  She is unable to make a composite full fist.  There does appear to be some minor malrotation when she attempts to make a fist.  Radiology: Dg Hand Complete Left  Result Date: 03/18/2019 CLINICAL DATA:  Left hand pain since crush injury. EXAM: LEFT HAND - COMPLETE 3+ VIEW COMPARISON:  No recent prior. FINDINGS: Slightly displaced fracture of the proximal phalanx of the left fourth digit noted. No  radiopaque foreign body. IMPRESSION: Slightly displaced fracture of the proximal phalanx of the left fourth digit. Electronically Signed   By: Maisie Fushomas  Register   On: 03/18/2019 12:41    Assessment and Plan:     ICD-10-CM   1. Closed displaced fracture of distal phalanx of left ring finger, initial encounter  [id number] Ambulatory referral to Hand Surgery   Level of Medical Decision-Making in this case is Moderate.   Mildly displaced fracture of the  fifth proximal phalanx, and in my opinion clinically there is some mild degree of malrotation.  Given possibility of possible permanent dysfunction with this finger and progression of malrotation, my preference is to involve hand surgery now.  Follow-up: No follow-ups on file.  No orders of the defined types were placed in this encounter.  Orders Placed This Encounter  Procedures  . Ambulatory referral to Hand Surgery    Signed,  Frederico Hamman T. Deysha Cartier, MD   Outpatient Encounter Medications as of 03/20/2019  Medication Sig  . acetaminophen (TYLENOL) 500 MG tablet Take 1,000 mg by mouth every 6 (six) hours as needed for mild pain, moderate pain or headache.  . ibuprofen (ADVIL,MOTRIN) 600 MG tablet Take 1 tablet (600 mg total) by mouth every 6 (six) hours.  Marland Kitchen oxyCODONE (OXY IR/ROXICODONE) 5 MG immediate release tablet Take 1 tablet by mouth daily as needed.  Marland Kitchen SYNTHROID 50 MCG tablet Take 1 tablet (50 mcg total) by mouth daily.   No facility-administered encounter medications on file as of 03/20/2019.

## 2019-03-20 ENCOUNTER — Other Ambulatory Visit: Payer: Self-pay

## 2019-03-20 ENCOUNTER — Encounter: Payer: Self-pay | Admitting: Family Medicine

## 2019-03-20 ENCOUNTER — Ambulatory Visit: Payer: BC Managed Care – PPO | Admitting: Family Medicine

## 2019-03-20 VITALS — BP 102/60 | HR 83 | Temp 98.8°F | Ht 66.0 in | Wt 173.8 lb

## 2019-03-20 DIAGNOSIS — S62635A Displaced fracture of distal phalanx of left ring finger, initial encounter for closed fracture: Secondary | ICD-10-CM

## 2019-03-20 NOTE — Patient Instructions (Signed)
REFERRALS TO SPECIALISTS, SPECIAL TESTS (MRI, CT, ULTRASOUNDS)  MARION or  Anastasiya will help you. ASK CHECK-IN FOR HELP.  Specialist appointment times vary a great deal, based on their schedule / openings. -- Some specialists have very long wait times. (Example. Dermatology)    

## 2019-03-21 DIAGNOSIS — S6722XA Crushing injury of left hand, initial encounter: Secondary | ICD-10-CM | POA: Diagnosis not present

## 2019-03-21 DIAGNOSIS — S62645A Nondisplaced fracture of proximal phalanx of left ring finger, initial encounter for closed fracture: Secondary | ICD-10-CM | POA: Diagnosis not present

## 2019-04-12 DIAGNOSIS — S6722XA Crushing injury of left hand, initial encounter: Secondary | ICD-10-CM | POA: Diagnosis not present

## 2019-04-13 ENCOUNTER — Other Ambulatory Visit: Payer: Self-pay | Admitting: Family Medicine

## 2019-04-18 ENCOUNTER — Other Ambulatory Visit: Payer: Self-pay | Admitting: Family Medicine

## 2019-04-19 ENCOUNTER — Other Ambulatory Visit: Payer: Self-pay | Admitting: Family Medicine

## 2019-05-10 DIAGNOSIS — S62615D Displaced fracture of proximal phalanx of left ring finger, subsequent encounter for fracture with routine healing: Secondary | ICD-10-CM | POA: Diagnosis not present

## 2019-05-10 DIAGNOSIS — S6722XA Crushing injury of left hand, initial encounter: Secondary | ICD-10-CM | POA: Diagnosis not present

## 2019-11-09 IMAGING — DX LEFT HAND - COMPLETE 3+ VIEW
3 series · 3 of 3 positions shown · non-contrast
Comparison: No recent prior.

CLINICAL DATA: Left hand pain since crush injury.

EXAM:
LEFT HAND - COMPLETE 3+ VIEW

[hand ap]
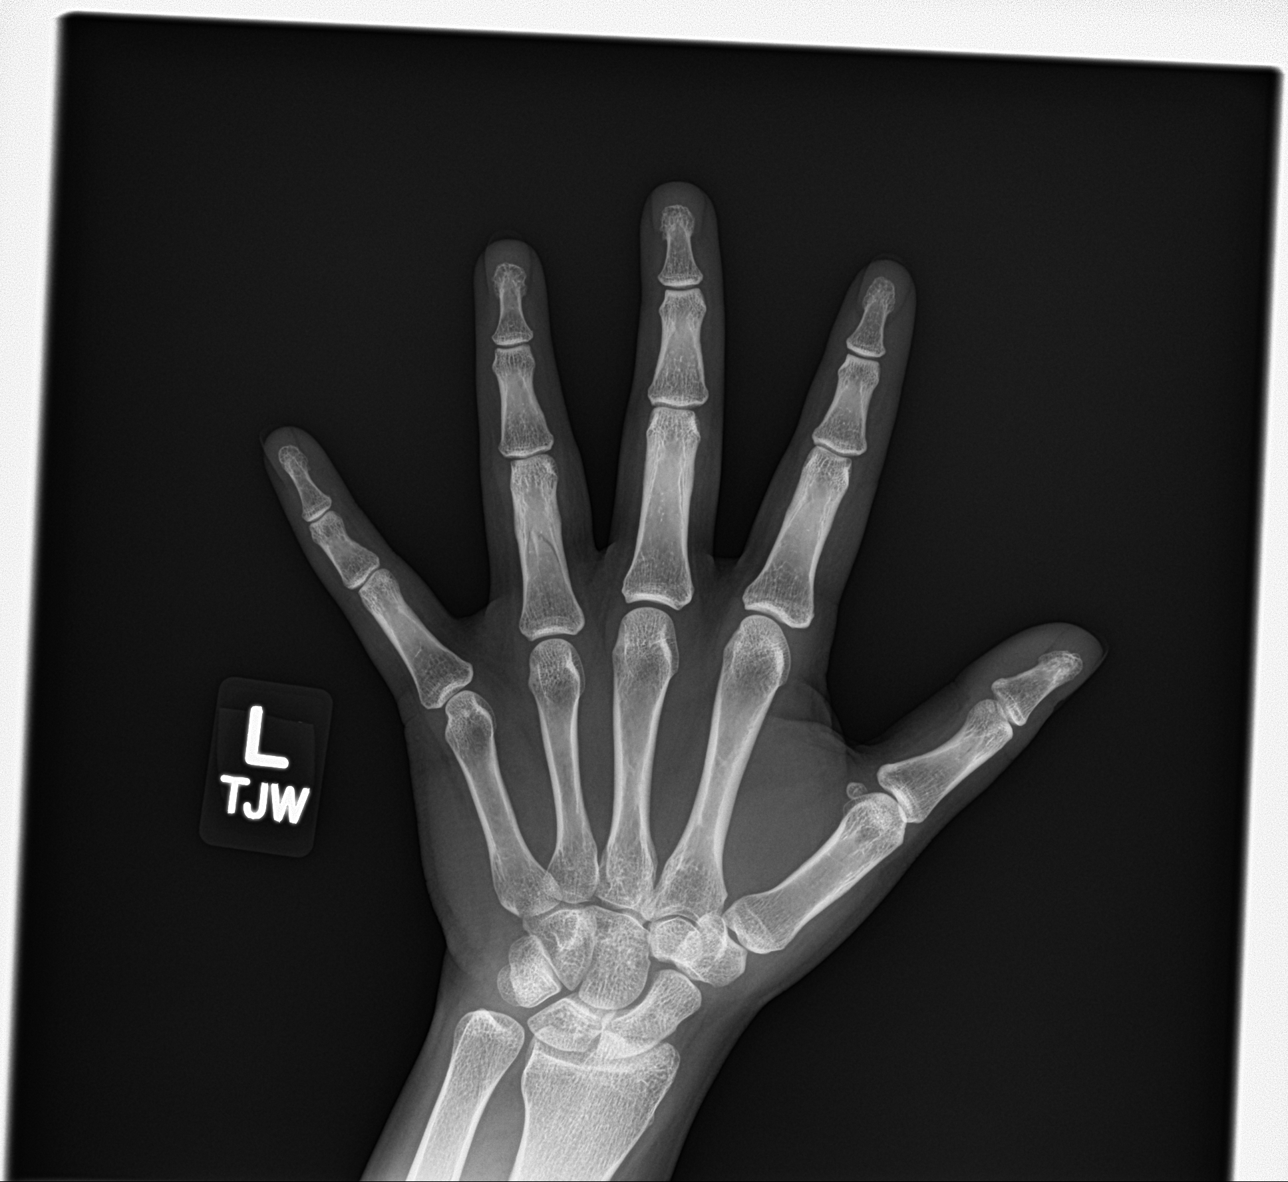

[hand obl]
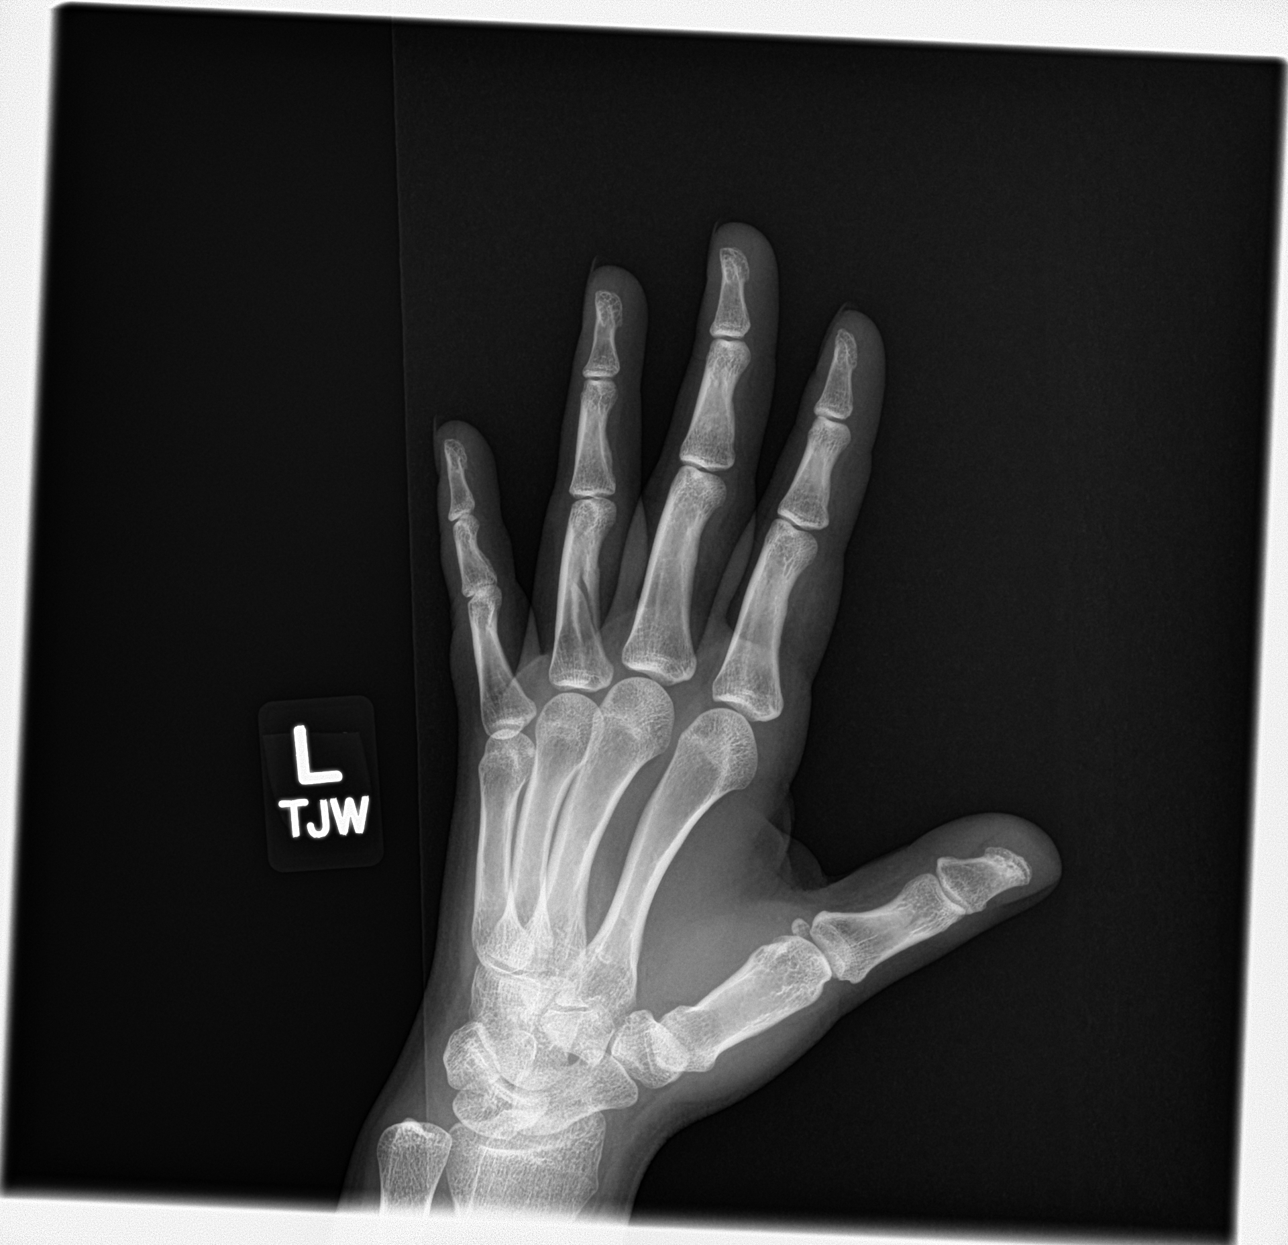

[hand lat]
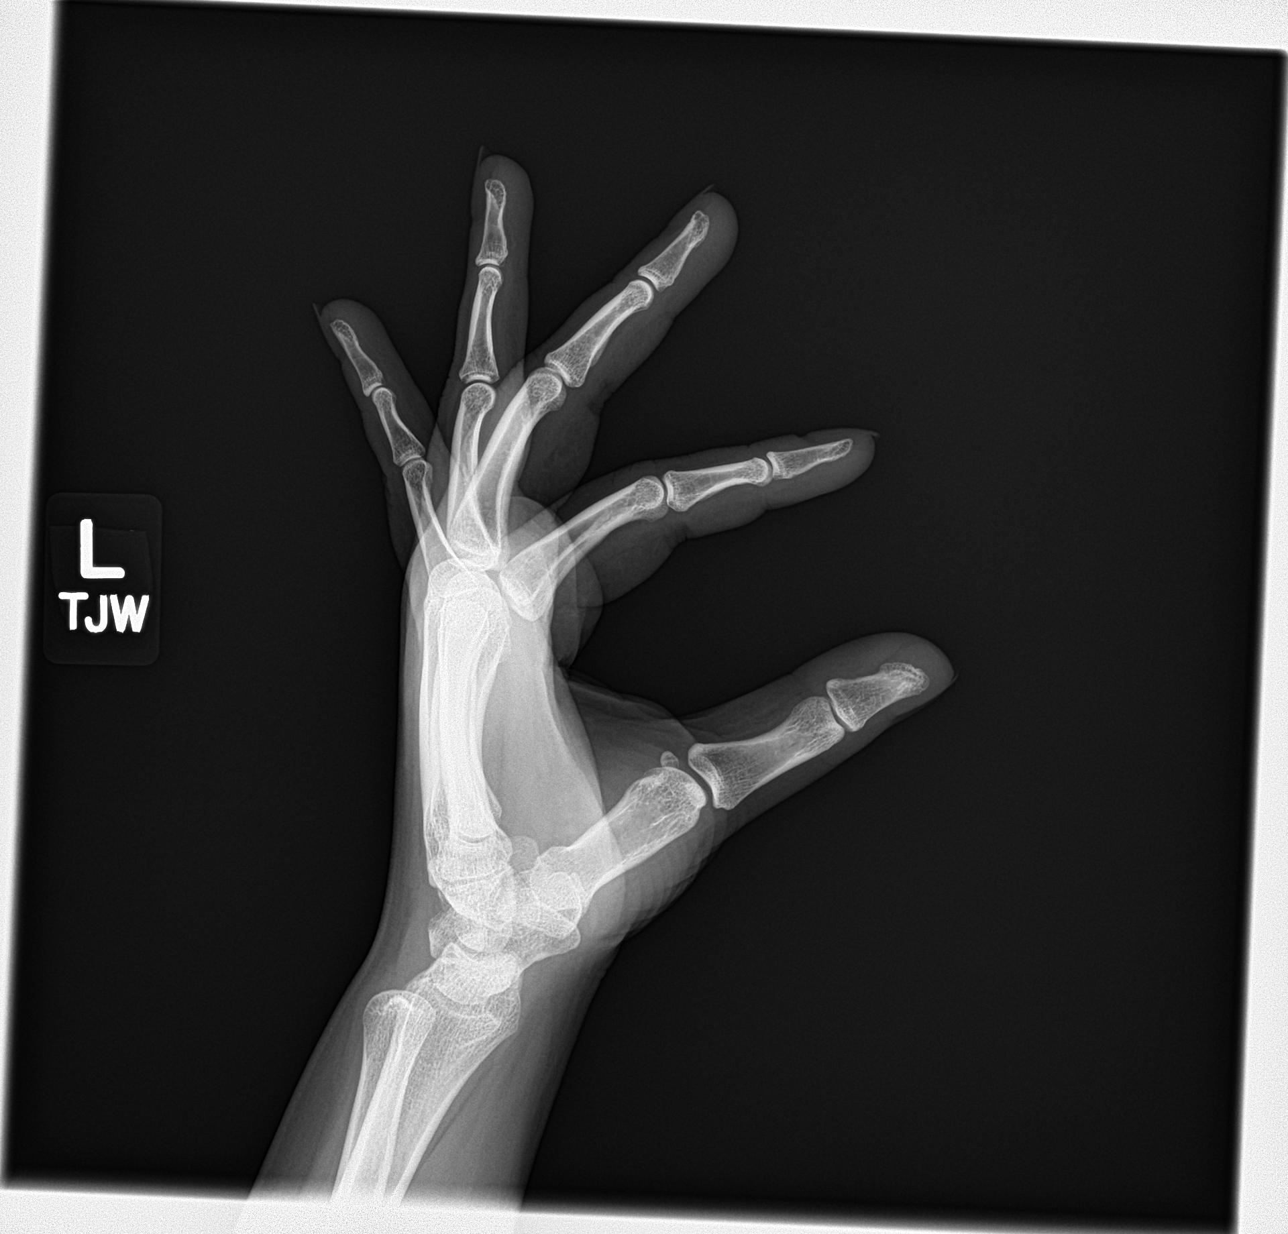

[3 of 3 positions shown; findings below may reference images not displayed]

FINDINGS: Slightly displaced fracture of the proximal phalanx of the left
fourth digit noted. No radiopaque foreign body.
IMPRESSION: Slightly displaced fracture of the proximal phalanx of the left
fourth digit.

## 2019-12-22 DIAGNOSIS — Z20828 Contact with and (suspected) exposure to other viral communicable diseases: Secondary | ICD-10-CM | POA: Diagnosis not present

## 2019-12-30 ENCOUNTER — Telehealth: Payer: Self-pay

## 2019-12-30 ENCOUNTER — Ambulatory Visit (INDEPENDENT_AMBULATORY_CARE_PROVIDER_SITE_OTHER): Payer: BC Managed Care – PPO | Admitting: Family Medicine

## 2019-12-30 ENCOUNTER — Encounter: Payer: Self-pay | Admitting: Family Medicine

## 2019-12-30 VITALS — Temp 97.8°F | Ht 66.0 in | Wt 165.0 lb

## 2019-12-30 DIAGNOSIS — U071 COVID-19: Secondary | ICD-10-CM | POA: Insufficient documentation

## 2019-12-30 DIAGNOSIS — Z8616 Personal history of COVID-19: Secondary | ICD-10-CM | POA: Insufficient documentation

## 2019-12-30 MED ORDER — BENZONATATE 200 MG PO CAPS: 200.0000 mg | ORAL_CAPSULE | Freq: Two times a day (BID) | ORAL | 0 refills | Status: DC | PRN

## 2019-12-30 NOTE — Telephone Encounter (Signed)
Pt was diagnosed with covid on 12/22/19; pt continues with head and chest congestion, dry cough, SOB, loss of taste & smell; red eye in rt eye, runny nose and fatigue. Pt is a Runner, broadcasting/film/video and wants to know if there is anything pt can take or do to get better ASAP. Pt scheduled virtual visit today at 11 AM with Dr Ermalene Searing. Pt will have T and wt ready when Lupita Leash CMA calls.

## 2019-12-30 NOTE — Assessment & Plan Note (Signed)
Symptoms improving. No sign of bacterial superinfection.  NO respiratory distress.  rest, fluids, benzonatate rx given, use mucinex for mucolytic and nasla saline spry.  Return precautions given. If severe SOB .. go to ER>

## 2019-12-30 NOTE — Progress Notes (Signed)
VIRTUAL VISIT Due to national recommendations of social distancing due to COVID 19, a virtual visit is felt to be most appropriate for this patient at this time.   I connected with the patient on 12/30/19 at 11:00 AM EDT by virtual telehealth platform and verified that I am speaking with the correct person using two identifiers.   I discussed the limitations, risks, security and privacy concerns of performing an evaluation and management service by  virtual telehealth platform and the availability of in person appointments. I also discussed with the patient that there may be a patient responsible charge related to this service. The patient expressed understanding and agreed to proceed.  Patient location: Home Provider Location: [facility] Participants: Kerby Nora and [name]   Chief Complaint  Patient presents with  . Cough    +Covid Test 12/22/2019  . Shortness of Breath    History of Present Illness:  29 year old female  patients of Dr. Royden Purl presents with ongoing COVID infection for continues shortness of breath, head and chest congestion, dry cough and loss of taste and smell. She reports redness in right eye, runny nose and fatigue.   She was diagnosed on 12/22/2019 with positive COVID19 test.  Symptoms started on 3/17  She is feeling better but she continues to be some shortness of breath and having congestion. Overall day to day she is feeling better. No further fever.   Noted red eye yesterday.. no irritation, no discharge.. she has taken out contacts.   She has been taking Nyquil/Dayquil with no relief.  Using mucinex 12 hour.       COVID 19 screen No recent travel or known exposure to COVID19 The patient denies respiratory symptoms of COVID 19 at this time.  The importance of social distancing was discussed today.   Review of Systems  Constitutional: Negative for chills and fever.  HENT: Positive for congestion. Negative for ear pain, sinus  pain and sore throat.   Eyes: Positive for redness. Negative for pain.  Respiratory: Positive for cough and shortness of breath.   Cardiovascular: Negative for chest pain, palpitations and leg swelling.  Gastrointestinal: Negative for abdominal pain, blood in stool, constipation, diarrhea, nausea and vomiting.  Genitourinary: Negative for dysuria.  Musculoskeletal: Negative for falls and myalgias.  Skin: Negative for rash.  Neurological: Negative for dizziness.  Psychiatric/Behavioral: Negative for depression. The patient is not nervous/anxious.       Past Medical History:  Diagnosis Date  . Classic migraine 08/14/2015  . Goiter    Hashimoto's thyroiditis  . H/O varicella   . HPV (human papilloma virus) anogenital infection    not high risk  . Hypothyroidism   . Oral herpes   . SVD (spontaneous vaginal delivery)    X 1    reports that she has never smoked. She has never used smokeless tobacco. She reports that she does not drink alcohol or use drugs.   Current Outpatient Medications:  .  acetaminophen (TYLENOL) 500 MG tablet, Take 1,000 mg by mouth every 6 (six) hours as needed for mild pain, moderate pain or headache., Disp: , Rfl:  .  SYNTHROID 50 MCG tablet, Take 1 tablet (50 mcg total) by mouth daily., Disp: 90 tablet, Rfl: 11   Observations/Objective: Temperature 97.8 F (36.6 C), temperature source Oral, height 5\' 6"  (1.676 m), weight 165 lb (74.8 kg), last menstrual period 12/14/2019.  Physical Exam  Physical Exam Constitutional:      General: The patient is  not in acute distress. Pulmonary:     Effort: Pulmonary effort is normal. No respiratory distress.  Neurological:     Mental Status: The patient is alert and oriented to person, place, and time.  Psychiatric:        Mood and Affect: Mood normal.        Behavior: Behavior normal.   Assessment and Plan COVID-19 virus infection  Symptoms improving. No sign of bacterial superinfection.  NO respiratory  distress.  rest, fluids, benzonatate rx given, use mucinex for mucolytic and nasla saline spry.  Return precautions given. If severe SOB .. go to ER>     I discussed the assessment and treatment plan with the patient. The patient was provided an opportunity to ask questions and all were answered. The patient agreed with the plan and demonstrated an understanding of the instructions.   The patient was advised to call back or seek an in-person evaluation if the symptoms worsen or if the condition fails to improve as anticipated.    Eliezer Lofts, MD

## 2020-01-30 ENCOUNTER — Telehealth (INDEPENDENT_AMBULATORY_CARE_PROVIDER_SITE_OTHER): Payer: BC Managed Care – PPO | Admitting: Family Medicine

## 2020-01-30 ENCOUNTER — Telehealth: Payer: Self-pay | Admitting: Family Medicine

## 2020-01-30 ENCOUNTER — Encounter: Payer: Self-pay | Admitting: Family Medicine

## 2020-01-30 DIAGNOSIS — Z03818 Encounter for observation for suspected exposure to other biological agents ruled out: Secondary | ICD-10-CM | POA: Diagnosis not present

## 2020-01-30 DIAGNOSIS — R0602 Shortness of breath: Secondary | ICD-10-CM | POA: Diagnosis not present

## 2020-01-30 DIAGNOSIS — Z20828 Contact with and (suspected) exposure to other viral communicable diseases: Secondary | ICD-10-CM | POA: Diagnosis not present

## 2020-01-30 NOTE — Assessment & Plan Note (Signed)
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

## 2020-01-30 NOTE — Patient Instructions (Signed)
Please go get tested for covid and call us with results  Stay isolated until then  Let us know if symptoms worsen or change

## 2020-01-30 NOTE — Progress Notes (Signed)
Virtual Visit via Video Note  I connected with Robin Pollard on 01/30/20 at 12:15 PM EDT by a video enabled telemedicine application and verified that I am speaking with the correct person using two identifiers.  Location: Patient: home Provider: office   I discussed the limitations of evaluation and management by telemedicine and the availability of in person appointments. The patient expressed understanding and agreed to proceed.  Parties involved in encounter  Patient: Robin Pollard  Provider:  Loura Pardon MD    History of Present Illness: Pt presents with uri symptoms   Had covid 6 wk ago -symptoms for 2-3 weeks then resolved Noticed last week some sob (? Wondered if lingering) Notified Monday that a student tested pos for covid (but her exposure was a week prior)  Now she feels the need to take deep breaths Had a migraine last night This am woke up -still a dull headache  Tired/poor exercise tolerance (she is a runner)   She has had stress at work  Also has migraines   No cough (but feels like she could cough any time with deep breath) No wheezing  No fever No loss of taste or smell  No ST or ear pain  No sinus pain or pressure  No nasal symptoms   No allergy symptoms   [facility] [place]    She was diagnosed with covid 19 on 12/22/19  (with symptoms starting 3/17)     Patient Active Problem List   Diagnosis Date Noted  . Shortness of breath 01/30/2020  . COVID-19 virus infection 12/30/2019  . Hand injury, left, initial encounter 03/18/2019  . Stress reaction 12/18/2017  . Anemia, iron deficiency 08/30/2016  . SVD (spontaneous vaginal delivery) 07/26/2016  . Screening for lipoid disorders 08/26/2015  . Classic migraine 08/14/2015  . Pre-employment examination 04/11/2011  . Hypothyroidism 11/13/2009  . Goiter 05/12/2008   Past Medical History:  Diagnosis Date  . Classic migraine 08/14/2015  . Goiter    Hashimoto's thyroiditis   . H/O varicella   . HPV (human papilloma virus) anogenital infection    not high risk  . Hypothyroidism   . Oral herpes   . SVD (spontaneous vaginal delivery)    X 1   Past Surgical History:  Procedure Laterality Date  . CYSTECTOMY     dermoid cyst  . DILATION AND EVACUATION N/A 02/12/2015   Procedure: DILATATION AND EVACUATION WITH ULTRASOUND GUIDANCE;  Surgeon: Dian Queen, MD;  Location: [facility];  Service: Gynecology;  Laterality: N/A;  . OOPHORECTOMY  2001   left  . teratoma removal    . WISDOM TOOTH EXTRACTION     Social History   Tobacco Use  . Smoking status: Never Smoker  . Smokeless tobacco: Never Used  Substance Use Topics  . Alcohol use: No    Alcohol/week: 0.0 standard drinks  . Drug use: No   Family History  Problem Relation Age of Onset  . Urolithiasis Sister   . Hypertension Mother   . Azure White syndrome Mother   . Urolithiasis Mother   . Cancer Mother        breast also melanoma  . Heart disease Maternal Grandmother        CAD  . Diabetes Maternal Grandfather   . Heart disease Paternal Grandfather        CAD  . Hypertension Paternal Grandfather   . Diabetes Paternal Grandfather   . Hypertension Paternal Grandmother   . Diabetes  Paternal Grandmother   . Heart disease Paternal Grandmother   . Migraines Neg Hx    No Known Allergies Current Outpatient Medications on File Prior to Visit  Medication Sig Dispense Refill  . SYNTHROID 50 MCG tablet Take 1 tablet (50 mcg total) by mouth daily. 90 tablet 11   No current facility-administered medications on file prior to visit.   Review of Systems  Constitutional: Negative for chills, fever and malaise/fatigue.       Fatigue   HENT: Negative for congestion, ear pain, sinus pain and sore throat.   Eyes: Negative for blurred vision, discharge and redness.  Respiratory: Positive for shortness of breath. Negative for cough, sputum production, wheezing and stridor.   Cardiovascular: Negative  for chest pain, palpitations, leg swelling and PND.  Gastrointestinal: Negative for abdominal pain, diarrhea, nausea and vomiting.  Musculoskeletal: Negative for myalgias.  Skin: Negative for rash.  Neurological: Positive for headaches. Negative for dizziness.    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 The patient was provided an opportunity to ask questions and all were answered. The patient agreed with the plan and demonstrated an understanding of the instructions.   The patient was advised to call back or seek an in-person evaluation if the symptoms worsen or if the condition fails to improve as anticipated.     Roxy Manns, MD

## 2020-01-30 NOTE — Telephone Encounter (Signed)
Spoke with patient and she is concerned that she may have Covid again.  Patient tested Positive on 12/22/19 Saw Dr Ermalene Searing on 12/30/19  Pt reports that all of her symptoms seemed to resolve.   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 The entire class including one other teacher are all quarantined   Pt was exposed a second time on Monday 4/25 - more of a distant exposure.   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   The school administrator has told her that since she is still in her 90 days of testing positive that she will not need to quarantine and can continue to work. Pt reports that she does have SOB, congestion starting back in her chest and fatigue - she has reported these symptoms to her Administrator and they are still recommending her to continue to work as normal.   Pt has not had any covid vaccines.

## 2020-01-30 NOTE — Telephone Encounter (Signed)
Left VM letting pt know Dr. Tower's comments  

## 2020-01-30 NOTE — Telephone Encounter (Signed)
Pt called back and scheduled an appt

## 2020-01-30 NOTE — Telephone Encounter (Signed)
I advise she goes home and please schedule virtual visit

## 2020-01-31 ENCOUNTER — Telehealth: Payer: Self-pay

## 2020-01-31 NOTE — Telephone Encounter (Signed)
That is good news  She can return to work if feeling up to it.  If her symptoms (shortness of breath) persist, I would refer to the post covid clinic so let me know  Thanks

## 2020-01-31 NOTE — Telephone Encounter (Signed)
Pt notified of Dr. Royden Purl comments. Pt said she thinks she's okay to return to work but if sxs don't improve she will call back for an appt at the post covid clinic

## 2020-01-31 NOTE — Telephone Encounter (Signed)
Patient called back to let us know that as of yesterday evening-01/30/20 her COVID result was negative. Patient was told to call us and let us know. Patient wanted to know the next step if any. CB [phone]

## 2020-02-09 ENCOUNTER — Telehealth: Payer: Self-pay | Admitting: Family Medicine

## 2020-02-10 NOTE — Telephone Encounter (Signed)
Med refilled once and Lyla Son will try and reach out to pt to get CPE or a f/u scheduled

## 2020-02-10 NOTE — Telephone Encounter (Signed)
Follow-up scheduled 5/18

## 2020-02-10 NOTE — Telephone Encounter (Signed)
I left a detailed message for patient to call back and schedule f/u or cpx and lab appointment.

## 2020-02-10 NOTE — Telephone Encounter (Signed)
Pt hasn't had a f/u or a TSH lab in over a year and no future appts., please advise

## 2020-02-10 NOTE — Telephone Encounter (Signed)
Please schedule a follow up and refill enough to get by until then thanks

## 2020-02-18 ENCOUNTER — Other Ambulatory Visit: Payer: Self-pay

## 2020-02-18 ENCOUNTER — Encounter: Payer: Self-pay | Admitting: Family Medicine

## 2020-02-18 ENCOUNTER — Ambulatory Visit: Payer: BC Managed Care – PPO | Admitting: Family Medicine

## 2020-02-18 VITALS — BP 110/60 | HR 80 | Temp 97.6°F | Ht 66.0 in | Wt 165.4 lb

## 2020-02-18 DIAGNOSIS — E049 Nontoxic goiter, unspecified: Secondary | ICD-10-CM | POA: Diagnosis not present

## 2020-02-18 DIAGNOSIS — E039 Hypothyroidism, unspecified: Secondary | ICD-10-CM

## 2020-02-18 NOTE — Patient Instructions (Signed)
You can get a covid vaccine 90 days after your illness   Take care of yourself   Get back to gyn for a check up   Labs for thyroid today

## 2020-02-18 NOTE — Assessment & Plan Note (Signed)
No change in exam or symptoms

## 2020-02-18 NOTE — Progress Notes (Signed)
Subjective:    Patient ID: Robin Pollard, female    DOB: Apr 29, 1991, 29 y.o.   MRN: [id number]  This visit occurred during the SARS-CoV-2 public health emergency.  Safety protocols were in place, including screening questions prior to the visit, additional usage of staff PPE, and extensive cleaning of exam room while observing appropriate contact time as indicated for disinfecting solutions.    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, theatre/television/film   Very busy- a lot of hours   Feeling well overall   Eating fairly healthy Wants to get back to exercise     Hypothyroidism  Pt has no clinical changes  (fatigue is baseline)  Past h/o Hashimotos thyroiditis  No change in energy level/ hair or skin/ edema and no tremor Lab Results  Component Value Date   TSH 1.27 01/14/2019    Taking 50 mcg of levothyroxine daily  Takes DAW (does not do well with generic)   Due for labs    covid history  End of march - feeling a lot better  Still has a little funny feeling in her chest  Had a lot of headaches at first-that is better   Otherwise migraines are controlled   Has not had a pap in 3 y  No recent abn paps and no HPV  She sees Dr Vincente Poli   Patient Active Problem List   Diagnosis Date Noted  . Shortness of breath 01/30/2020  . COVID-19 virus infection 12/30/2019  . Hand injury, left, initial encounter 03/18/2019  . Stress reaction 12/18/2017  . Anemia, iron deficiency 08/30/2016  . SVD (spontaneous vaginal delivery) 07/26/2016  . Screening for lipoid disorders 08/26/2015  . Classic migraine 08/14/2015  . Pre-employment examination 04/11/2011  . Hypothyroidism 11/13/2009  . Goiter 05/12/2008   Past Medical History:  Diagnosis Date  . Classic migraine  08/14/2015  . Goiter    Hashimoto's thyroiditis  . H/O varicella   . HPV (human papilloma virus) anogenital infection    not high risk  . Hypothyroidism   . Oral herpes   . SVD (spontaneous vaginal delivery)    X 1   Past Surgical History:  Procedure Laterality Date  . CYSTECTOMY     dermoid cyst  . DILATION AND EVACUATION N/A 02/12/2015   Procedure: DILATATION AND EVACUATION WITH ULTRASOUND GUIDANCE;  Surgeon: Marcelle Overlie, MD;  Location: [facility];  Service: Gynecology;  Laterality: N/A;  . OOPHORECTOMY  2001   left  . teratoma removal    . WISDOM TOOTH EXTRACTION     Social History   Tobacco Use  . Smoking status: Never Smoker  . Smokeless tobacco: Never Used  Substance Use Topics  . Alcohol use: No    Alcohol/week: 0.0 standard drinks  . Drug use: No   Family History  Problem Relation Age of Onset  . Urolithiasis Sister   . Hypertension Mother   . Evelene Croon Parkinson White syndrome Mother   . Urolithiasis Mother   . Cancer Mother        breast also melanoma  . Heart disease Maternal Grandmother        CAD  . Diabetes Maternal Grandfather   . Heart disease Paternal Grandfather  CAD  . Hypertension Paternal Grandfather   . Diabetes Paternal Grandfather   . Hypertension Paternal Grandmother   . Diabetes Paternal Grandmother   . Heart disease Paternal Grandmother   . Migraines Neg Hx    No Known Allergies Current Outpatient Medications on File Prior to Visit  Medication Sig Dispense Refill  . SYNTHROID 50 MCG tablet TAKE 1 TABLET BY MOUTH EVERY DAY 30 tablet 0   No current facility-administered medications on file prior to visit.      Review of Systems  Constitutional: Negative for activity change, appetite change, fatigue, fever and unexpected weight change.  HENT: Negative for congestion, ear pain, rhinorrhea, sinus pressure and sore throat.   Eyes: Negative for pain, redness and visual disturbance.  Respiratory: Negative for cough, shortness of  breath and wheezing.   Cardiovascular: Negative for chest pain and palpitations.  Gastrointestinal: Negative for abdominal pain, blood in stool, constipation and diarrhea.  Endocrine: Negative for polydipsia and polyuria.  Genitourinary: Negative for dysuria, frequency and urgency.  Musculoskeletal: Negative for arthralgias, back pain and myalgias.  Skin: Negative for pallor and rash.  Allergic/Immunologic: Negative for environmental allergies.  Neurological: Negative for dizziness, syncope and headaches.  Hematological: Negative for adenopathy. Does not bruise/bleed easily.  Psychiatric/Behavioral: Negative for decreased concentration and dysphoric mood. The patient is not nervous/anxious.        Objective:   Physical Exam Constitutional:      General: She is not in acute distress.    Appearance: Normal appearance. She is well-developed and normal weight. She is not ill-appearing.  HENT:     Head: Normocephalic and atraumatic.  Eyes:     Conjunctiva/sclera: Conjunctivae normal.     Pupils: Pupils are equal, round, and reactive to light.  Neck:     Thyroid: No thyromegaly.     Vascular: No carotid bruit or JVD.     Comments: Stable symmetric thyroid enlargement (goiter) No thyroid bruit or tenderness  Cardiovascular:     Rate and Rhythm: Normal rate and regular rhythm.     Heart sounds: Normal heart sounds. No gallop.   Pulmonary:     Effort: Pulmonary effort is normal. No respiratory distress.     Breath sounds: Normal breath sounds. No wheezing or rales.  Abdominal:     General: Abdomen is flat. There is no abdominal bruit.  Musculoskeletal:     Cervical back: Normal range of motion and neck supple.  Lymphadenopathy:     Cervical: No cervical adenopathy.  Skin:    General: Skin is warm and dry.     Coloration: Skin is not pale.     Findings: No rash.     Comments: Skin is not overly dry  Neurological:     Mental Status: She is alert.     Motor: No tremor.      Coordination: Coordination normal.     Deep Tendon Reflexes: Reflexes are normal and symmetric. Reflexes normal.  Psychiatric:        Mood and Affect: Mood normal.     Comments: Pleasant and talkative            Assessment & Plan:   Problem List Items Addressed This Visit      Endocrine   Goiter    No change in exam or symptoms       Relevant Orders   TSH   Hypothyroidism - Primary    No clinical changes  Takes 50 mcg levothyroxine daily (daw)  TSH today-will change  dose if warranted        Relevant Orders   TSH

## 2020-02-18 NOTE — Assessment & Plan Note (Signed)
No clinical changes  Takes 50 mcg levothyroxine daily (daw)  TSH today-will change dose if warranted

## 2020-02-19 LAB — TSH: TSH: 0.79 u[IU]/mL (ref 0.35–4.50)

## 2020-02-21 ENCOUNTER — Other Ambulatory Visit: Payer: Self-pay | Admitting: *Deleted

## 2020-02-21 MED ORDER — SYNTHROID 50 MCG PO TABS: 50.0000 ug | ORAL_TABLET | Freq: Every day | ORAL | 3 refills | Status: DC

## 2020-02-21 NOTE — Telephone Encounter (Signed)
Med refilled.

## 2020-02-21 NOTE — Telephone Encounter (Signed)
Left VM letting pt know TSH labs and request she call the office back to advise [place] what pharmacy to send the synthroid to

## 2020-02-21 NOTE — Telephone Encounter (Signed)
-----   Message from Judy Pimple, MD sent at 02/19/2020 10:45 AM EDT ----- No changes needed  She takes DAW levothyroxine 50 mcg daily  Please refill for the year at her preferred pharmacy

## 2020-02-21 NOTE — Telephone Encounter (Signed)
Patient advised of TSH Labs.   Pharmacy is [id number] - [place], [place] - [address] [place] [address] [place] [address]

## 2020-04-01 NOTE — Telephone Encounter (Signed)
Pt had appt on 12/30/19.

## 2020-04-12 ENCOUNTER — Other Ambulatory Visit: Payer: Self-pay | Admitting: Family Medicine

## 2020-04-16 ENCOUNTER — Other Ambulatory Visit: Payer: Self-pay | Admitting: Family Medicine

## 2020-06-11 ENCOUNTER — Telehealth: Payer: Self-pay

## 2020-06-11 NOTE — Telephone Encounter (Signed)
Pt left v/m at 4:49 requesting cb about questions with lab results.

## 2020-06-12 NOTE — Telephone Encounter (Signed)
Left VM requesting pt to call the office back 

## 2020-06-17 NOTE — Telephone Encounter (Signed)
Pt had labs done for ins co and request to discuss lab results on hgb A1C. Pt said did home kits for hgb A1C and results on 05/28/20 were 4.9 and pt had been eating healthy prior to lab being done. Lab requested pt to repeat due to problems processing the test. The 2nd Hgb A1C was 6.3  And pt had eaten fast foods prior to having the 2nd hgb A1C done. Pt wants to know if she is considered pre diabetic; pt said she is basically healthy and does not have hx of diabetes to her knowledge in her family. Pt said for 1 - 2 months pt has had frequent urination but attributed that to drinking more coffee. Pt has been fatigued but gets up every morning at 4 AM and pt has had some blurred vision. Pt wants to know what to do if anything. Pt request cb after Dr Milinda Antis reviews. 08/09/2015 basic metabolic panel done. Pt only takes prescription med for thyroid.

## 2020-06-17 NOTE — Telephone Encounter (Signed)
6.3 is considered prediabetic  I would like to see her in the office to re check and discuss  Try to avoid sugary foods/junk foods

## 2020-06-17 NOTE — Telephone Encounter (Signed)
Pt called back and scheduled appt

## 2020-06-17 NOTE — Telephone Encounter (Signed)
Per DPR left VM letting pt know Dr. Royden Purl comments and requested pt to call the office back and schedule an appt

## 2020-06-22 ENCOUNTER — Ambulatory Visit (INDEPENDENT_AMBULATORY_CARE_PROVIDER_SITE_OTHER): Payer: BC Managed Care – PPO | Admitting: Family Medicine

## 2020-06-22 ENCOUNTER — Other Ambulatory Visit: Payer: Self-pay

## 2020-06-22 ENCOUNTER — Encounter: Payer: Self-pay | Admitting: Family Medicine

## 2020-06-22 VITALS — BP 98/62 | HR 61 | Temp 97.3°F | Ht 66.0 in | Wt 169.6 lb

## 2020-06-22 DIAGNOSIS — Z8616 Personal history of COVID-19: Secondary | ICD-10-CM | POA: Diagnosis not present

## 2020-06-22 DIAGNOSIS — R7303 Prediabetes: Secondary | ICD-10-CM

## 2020-06-22 DIAGNOSIS — E039 Hypothyroidism, unspecified: Secondary | ICD-10-CM

## 2020-06-22 LAB — POCT GLYCOSYLATED HEMOGLOBIN (HGB A1C): Hemoglobin A1C: 5.1 % (ref 4.0–5.6)

## 2020-06-22 NOTE — Patient Instructions (Addendum)
Your A1C was 5.1 That is fine  Try to get most of your carbohydrates from produce (with the exception of white potatoes)  Eat less bread/pasta/rice/snack foods/cereals/sweets and other items from the middle of the grocery store (processed carbs)   Keep exercising   COVID-19 Vaccine Information can be found at: [employer] For questions related to vaccine distribution or appointments, please email vaccine@[email or web address] or call [phone].

## 2020-06-22 NOTE — Assessment & Plan Note (Signed)
Hypothyroidism  Pt has no clinical changes No change in energy level/ hair or skin/ edema and no tremor Lab Results  Component Value Date   TSH 0.79 02/18/2020    Continues 50 mcg of levothyroxine daily

## 2020-06-22 NOTE — Assessment & Plan Note (Signed)
Fully recovered  Strongly encouraged her to consider vaccination  She has not had one for political reasons (per pt)  Offered to answer questions about the vaccine and web site given to get more info

## 2020-06-22 NOTE — Progress Notes (Signed)
Subjective:    Patient ID: Robin Pollard, female    DOB: March 18, 1991, 29 y.o.   MRN: [id number]  This visit occurred during the SARS-CoV-2 public health emergency.  Safety protocols were in place, including screening questions prior to the visit, additional usage of staff PPE, and extensive cleaning of exam room while observing appropriate contact time as indicated for disinfecting solutions.    HPI Pt presents to discuss prediabetes   Wt Readings from Last 3 Encounters:  06/22/20 169 lb 9 oz (76.9 kg)  02/18/20 165 lb 7 oz (75 kg)  01/30/20 163 lb (73.9 kg)   27.37 kg/m   Had an a1c of 6.3 out of the office Does yearly blood work for [employer]  Had to have repeat - 4.9 to 6.3  ? Lab error   Lab Results  Component Value Date   HGBA1C 5.1 06/22/2020     Triglycerides 206  HDL 75  She has been urinating more  Some fatigue-but gets up at 4 am every day  ? 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 Prediabetes 06/22/2020  . Shortness of breath 01/30/2020  . History of COVID-19 12/30/2019  . Hand injury, left, initial encounter 03/18/2019  . Stress reaction 12/18/2017  . Anemia, iron deficiency 08/30/2016  . SVD (spontaneous vaginal delivery) 07/26/2016  . Screening for lipoid disorders 08/26/2015  . Classic migraine 08/14/2015  . Pre-employment examination 04/11/2011  . Hypothyroidism 11/13/2009  . Goiter 05/12/2008   Past Medical History:  Diagnosis Date  . Classic migraine  08/14/2015  . Goiter    Hashimoto's thyroiditis  . H/O varicella   . HPV (human papilloma virus) anogenital infection    not high risk  . Hypothyroidism   . Oral herpes   . SVD (spontaneous vaginal delivery)    X 1   Past Surgical History:  Procedure Laterality Date  . CYSTECTOMY     dermoid cyst  . DILATION AND EVACUATION N/A 02/12/2015   Procedure: DILATATION AND EVACUATION WITH ULTRASOUND GUIDANCE;  Surgeon: Marcelle Overlie, MD;  Location: [facility];  Service: Gynecology;  Laterality: N/A;  . OOPHORECTOMY  2001   left  . teratoma removal    . WISDOM TOOTH EXTRACTION     Social History   Tobacco Use  . Smoking status: Never Smoker  . Smokeless tobacco: Never Used  Substance Use Topics  . Alcohol use: No    Alcohol/week: 0.0 standard drinks  . Drug use: No   Family History  Problem Relation Age of Onset  . Urolithiasis Sister   . Hypertension Mother   . Evelene Croon Parkinson White syndrome Mother   . Urolithiasis Mother   . Cancer Mother        breast also melanoma  . Heart disease Maternal Grandmother        CAD  . Diabetes Maternal Grandfather   . Heart disease Paternal Grandfather  CAD  . Hypertension Paternal Grandfather   . Diabetes Paternal Grandfather   . Hypertension Paternal Grandmother   . Diabetes Paternal Grandmother   . Heart disease Paternal Grandmother   . Migraines Neg Hx    No Known Allergies Current Outpatient Medications on File Prior to Visit  Medication Sig Dispense Refill  . SYNTHROID 50 MCG tablet Take 1 tablet (50 mcg total) by mouth daily. 90 tablet 3   No current facility-administered medications on file prior to visit.    Review of Systems  Constitutional: Negative for activity change, appetite change, fever and unexpected weight change.       Very busy schedule  HENT: Negative for congestion, ear pain, rhinorrhea, sinus pressure and sore throat.   Eyes: Negative for pain, redness and visual disturbance.  Respiratory: Negative  for cough, shortness of breath and wheezing.   Cardiovascular: Negative for chest pain and palpitations.  Gastrointestinal: Negative for abdominal pain, blood in stool, constipation and diarrhea.  Endocrine: Negative for polydipsia and polyuria.  Genitourinary: Negative for dysuria, frequency and urgency.  Musculoskeletal: Negative for arthralgias, back pain and myalgias.  Skin: Negative for pallor and rash.  Allergic/Immunologic: Negative for environmental allergies.  Neurological: Negative for dizziness, syncope and headaches.  Hematological: Negative for adenopathy. Does not bruise/bleed easily.  Psychiatric/Behavioral: Negative for decreased concentration and dysphoric mood. The patient is not nervous/anxious.        Objective:   Physical Exam Constitutional:      General: She is not in acute distress.    Appearance: Normal appearance. She is normal weight. She is not ill-appearing.  HENT:     Head: Atraumatic.  Eyes:     Conjunctiva/sclera: Conjunctivae normal.     Pupils: Pupils are equal, round, and reactive to light.  Cardiovascular:     Rate and Rhythm: Normal rate and regular rhythm.  Pulmonary:     Effort: Pulmonary effort is normal. No respiratory distress.     Breath sounds: Normal breath sounds. No wheezing.  Musculoskeletal:     Cervical back: Neck supple. No tenderness.  Lymphadenopathy:     Cervical: No cervical adenopathy.  Skin:    Coloration: Skin is not pale.  Neurological:     Mental Status: She is alert.     Coordination: Coordination normal.  Psychiatric:        Mood and Affect: Mood normal.           Assessment & Plan:   Problem List Items Addressed This Visit      Endocrine   Hypothyroidism    Hypothyroidism  Pt has no clinical changes No change in energy level/ hair or skin/ edema and no tremor Lab Results  Component Value Date   TSH 0.79 02/18/2020    Continues 50 mcg of levothyroxine daily        Other   History of COVID-19     Fully recovered  Strongly encouraged her to consider vaccination  She has not had one for political reasons (per pt)  Offered to answer questions about the vaccine and web site given to get more info      Prediabetes - Primary    Pt had likely erroneous A1C of 6.3 from ins co Today Lab Results  Component Value Date   HGBA1C 5.1 06/22/2020   Very reassuring  Disc diet/exercise  Enc her to get carbs from produce        Relevant Orders   POCT glycosylated hemoglobin (Hb A1C) (Completed)

## 2020-06-22 NOTE — Assessment & Plan Note (Signed)
Pt had likely erroneous A1C of 6.3 from ins co Today Lab Results  Component Value Date   HGBA1C 5.1 06/22/2020   Very reassuring  Disc diet/exercise  Enc her to get carbs from produce

## 2020-11-25 DIAGNOSIS — H16041 Marginal corneal ulcer, right eye: Secondary | ICD-10-CM | POA: Diagnosis not present

## 2021-01-18 ENCOUNTER — Encounter: Payer: Self-pay | Admitting: *Deleted

## 2021-01-18 DIAGNOSIS — Z01419 Encounter for gynecological examination (general) (routine) without abnormal findings: Secondary | ICD-10-CM | POA: Diagnosis not present

## 2021-01-18 DIAGNOSIS — Z6827 Body mass index (BMI) 27.0-27.9, adult: Secondary | ICD-10-CM | POA: Diagnosis not present

## 2021-01-29 ENCOUNTER — Encounter: Payer: Self-pay | Admitting: *Deleted

## 2021-03-10 ENCOUNTER — Other Ambulatory Visit: Payer: Self-pay | Admitting: Family Medicine

## 2021-03-10 NOTE — Telephone Encounter (Signed)
No TSH level checked in over a year and no future appts, please advise

## 2021-03-10 NOTE — Telephone Encounter (Signed)
Thyroid med refilled once and will route to [facility] to f/u on scheduling appts.  Schedule non fasting lab to check thyroid now and f/u or CPE in Fall with PCP

## 2021-03-10 NOTE — Telephone Encounter (Signed)
Please schedule labs for TSH when able   (I can wait on a visit until the fall)  Thanks  Refill until lab

## 2021-03-17 ENCOUNTER — Telehealth: Payer: Self-pay | Admitting: Family Medicine

## 2021-03-17 DIAGNOSIS — E039 Hypothyroidism, unspecified: Secondary | ICD-10-CM

## 2021-03-17 NOTE — Telephone Encounter (Signed)
Called patient and scheduled a lab and CPE. Robin Pollard states when she picked up medication, pharmacy stated that we needed to contact insurance to see why the medication is no longer covered.

## 2021-03-17 NOTE — Telephone Encounter (Signed)
-----   Message from Alvina Chou sent at 03/17/2021 11:04 AM EDT ----- Regarding: Lab orders for Thursday, 6.16.22 Lab orders for Thyroid? Thanks, t

## 2021-03-18 ENCOUNTER — Other Ambulatory Visit (INDEPENDENT_AMBULATORY_CARE_PROVIDER_SITE_OTHER): Payer: BC Managed Care – PPO

## 2021-03-18 ENCOUNTER — Other Ambulatory Visit: Payer: Self-pay

## 2021-03-18 DIAGNOSIS — E039 Hypothyroidism, unspecified: Secondary | ICD-10-CM | POA: Diagnosis not present

## 2021-03-18 LAB — TSH: TSH: 1.22 u[IU]/mL (ref 0.35–4.50)

## 2021-03-19 NOTE — Telephone Encounter (Signed)
Patient called and stated that she was going to pay full price for medication. Pharmacy stated that they needed a prior authorization and she didn't want levothyroxine she wanted brand name.

## 2021-05-14 ENCOUNTER — Encounter: Payer: Self-pay | Admitting: Family Medicine

## 2021-05-14 ENCOUNTER — Other Ambulatory Visit: Payer: Self-pay

## 2021-05-14 ENCOUNTER — Telehealth: Payer: Self-pay | Admitting: Family Medicine

## 2021-05-14 ENCOUNTER — Ambulatory Visit (INDEPENDENT_AMBULATORY_CARE_PROVIDER_SITE_OTHER): Payer: BC Managed Care – PPO | Admitting: Family Medicine

## 2021-05-14 VITALS — BP 112/70 | HR 78 | Temp 98.0°F | Ht 66.5 in | Wt 168.6 lb

## 2021-05-14 DIAGNOSIS — Z0000 Encounter for general adult medical examination without abnormal findings: Secondary | ICD-10-CM

## 2021-05-14 DIAGNOSIS — E049 Nontoxic goiter, unspecified: Secondary | ICD-10-CM

## 2021-05-14 DIAGNOSIS — Z23 Encounter for immunization: Secondary | ICD-10-CM | POA: Diagnosis not present

## 2021-05-14 DIAGNOSIS — E039 Hypothyroidism, unspecified: Secondary | ICD-10-CM

## 2021-05-14 DIAGNOSIS — R7303 Prediabetes: Secondary | ICD-10-CM

## 2021-05-14 LAB — LIPID PANEL
Cholesterol: 172 mg/dL (ref 0–200)
HDL: 61.9 mg/dL (ref >39.00)
LDL Cholesterol: 94 mg/dL (ref 0–99)
NonHDL: 109.98
Total CHOL/HDL Ratio: 3
Triglycerides: 82 mg/dL (ref 0.0–149.0)
VLDL: 16.4 mg/dL (ref 0.0–40.0)

## 2021-05-14 LAB — COMPREHENSIVE METABOLIC PANEL
ALT: 10 U/L (ref 0–35)
AST: 14 U/L (ref 0–37)
Albumin: 4.2 g/dL (ref 3.5–5.2)
Alkaline Phosphatase: 42 U/L (ref 39–117)
BUN: 13 mg/dL (ref 6–23)
CO2: 26 mEq/L (ref 19–32)
Calcium: 9.3 mg/dL (ref 8.4–10.5)
Chloride: 104 mEq/L (ref 96–112)
Creatinine, Ser: 0.73 mg/dL (ref 0.40–1.20)
GFR: 108.7 mL/min (ref >60.00)
Glucose, Bld: 81 mg/dL (ref 70–99)
Potassium: 4.3 mEq/L (ref 3.5–5.1)
Sodium: 137 mEq/L (ref 135–145)
Total Bilirubin: 0.7 mg/dL (ref 0.2–1.2)
Total Protein: 7.1 g/dL (ref 6.0–8.3)

## 2021-05-14 LAB — CBC WITH DIFFERENTIAL/PLATELET
Basophils Absolute: 0 10*3/uL (ref 0.0–0.1)
Basophils Relative: 0.5 % (ref 0.0–3.0)
Eosinophils Absolute: 0.1 10*3/uL (ref 0.0–0.7)
Eosinophils Relative: 1.8 % (ref 0.0–5.0)
HCT: 41.5 % (ref 36.0–46.0)
Hemoglobin: 13.6 g/dL (ref 12.0–15.0)
Lymphocytes Relative: 24.8 % (ref 12.0–46.0)
Lymphs Abs: 1.3 10*3/uL (ref 0.7–4.0)
MCHC: 32.8 g/dL (ref 30.0–36.0)
MCV: 90.3 fl (ref 78.0–100.0)
Monocytes Absolute: 0.6 10*3/uL (ref 0.1–1.0)
Monocytes Relative: 12 % (ref 3.0–12.0)
Neutro Abs: 3.2 10*3/uL (ref 1.4–7.7)
Neutrophils Relative %: 60.9 % (ref 43.0–77.0)
Platelets: 222 10*3/uL (ref 150.0–400.0)
RBC: 4.59 Mil/uL (ref 3.87–5.11)
RDW: 12.4 % (ref 11.5–15.5)
WBC: 5.3 10*3/uL (ref 4.0–10.5)

## 2021-05-14 LAB — HEMOGLOBIN A1C: Hgb A1c MFr Bld: 5.3 % (ref 4.6–6.5)

## 2021-05-14 MED ORDER — SYNTHROID 50 MCG PO TABS: 50.0000 ug | ORAL_TABLET | Freq: Every day | ORAL | 3 refills | Status: DC

## 2021-05-14 NOTE — Telephone Encounter (Signed)
Type of forms received: ? ?Routed to: ? ?Paperwork received by :  ? ? ?Individual made aware of 3-5 business day turn around (Y/N): ? ?Form completed and patient made aware of charges(Y/N): ? ? ?Faxed to :  ? ?Form location:  ? ?

## 2021-05-14 NOTE — Patient Instructions (Addendum)
Get your flu shot in the fall   Take care of yourself  Use sun protection   Labs today   Tetanus shot today   Keep exercising   Stop at check out so we can send for your pap

## 2021-05-14 NOTE — Progress Notes (Signed)
Subjective:    Patient ID: Robin Pollard, female    DOB: 1991/08/09, 30 y.o.   MRN: [id number]  This visit occurred during the SARS-CoV-2 public health emergency.  Safety protocols were in place, including screening questions prior to the visit, additional usage of staff PPE, and extensive cleaning of exam room while observing appropriate contact time as indicated for disinfecting solutions.   HPI Here for health maintenance exam and to review chronic medical problems    Wt Readings from Last 3 Encounters:  05/14/21 168 lb 9 oz (76.5 kg)  06/22/20 169 lb 9 oz (76.9 kg)  02/18/20 165 lb 7 oz (75 kg)   26.80 kg/m Had a good summer  Feeling good  Taking care of herself   Pap 11/17- had one recent /sees gyn , [facility]us ? Several mo ago with neg hpv  Menses-regular periods  No birth control  Does not need it    Self breast exam -no lumps   Flu shot -fall Tdap 7/12 due  Covid status -has had covid /not vaccinated /unsure if she will   BP Readings from Last 3 Encounters:  05/14/21 112/70  06/22/20 98/62  02/18/20 110/60   Pulse Readings from Last 3 Encounters:  05/14/21 78  06/22/20 61  02/18/20 80   Hypothyroidism  Pt has no clinical changes No change in energy level/ hair or skin/ edema and no tremor Lab Results  Component Value Date   TSH 1.22 03/18/2021    Taking levothyroxine 50 mcg daily  Feels fine  No changes  Tired from her schedule    Prediabetes Lab Results  Component Value Date   HGBA1C 5.1 06/22/2020   Due for labs  Is mindful of diet   Exercise- was going to [employer]  Also home gym    Lab Results  Component Value Date   CHOL 145 12/18/2017   HDL 58.90 12/18/2017   LDLCALC 75 12/18/2017   TRIG 58.0 12/18/2017   CHOLHDL 2 12/18/2017   Due for labs   Patient Active Problem List   Diagnosis Date Noted   Routine general medical examination at a health care facility 05/14/2021   Prediabetes 06/22/2020   Shortness of breath  01/30/2020   History of COVID-19 12/30/2019   Anemia, iron deficiency 08/30/2016   Screening for lipoid disorders 08/26/2015   Classic migraine 08/14/2015   Pre-employment examination 04/11/2011   Hypothyroidism 11/13/2009   Goiter 05/12/2008   Past Medical History:  Diagnosis Date   Classic migraine 08/14/2015   Goiter    Hashimoto's thyroiditis   H/O varicella    HPV (human papilloma virus) anogenital infection    not high risk   Hypothyroidism    Oral herpes    SVD (spontaneous vaginal delivery)    X 1   Past Surgical History:  Procedure Laterality Date   CYSTECTOMY     dermoid cyst   DILATION AND EVACUATION N/A 02/12/2015   Procedure: DILATATION AND EVACUATION WITH ULTRASOUND GUIDANCE;  Surgeon: Marcelle Overlie, MD;  Location: [facility];  Service: Gynecology;  Laterality: N/A;   OOPHORECTOMY  2001   left   teratoma removal     WISDOM TOOTH EXTRACTION     Social History   Tobacco Use   Smoking status: Never   Smokeless tobacco: Never  Substance Use Topics   Alcohol use: No    Alcohol/week: 0.0 standard drinks   Drug use: No   Family History  Problem Relation Age of Onset  Urolithiasis Sister    Hypertension Mother    Phillips Odor White syndrome Mother    Urolithiasis Mother    Cancer Mother        breast also melanoma   Heart disease Maternal Grandmother        CAD   Diabetes Maternal Grandfather    Heart disease Paternal Grandfather        CAD   Hypertension Paternal Grandfather    Diabetes Paternal Grandfather    Hypertension Paternal Grandmother    Diabetes Paternal Grandmother    Heart disease Paternal Grandmother    Migraines Neg Hx    No Known Allergies No current outpatient medications on file prior to visit.   No current facility-administered medications on file prior to visit.    Review of Systems  Constitutional:  Negative for activity change, appetite change, fatigue, fever and unexpected weight change.  HENT:  Negative for  congestion, ear pain, rhinorrhea, sinus pressure and sore throat.   Eyes:  Negative for pain, redness and visual disturbance.  Respiratory:  Negative for cough, shortness of breath and wheezing.   Cardiovascular:  Negative for chest pain and palpitations.  Gastrointestinal:  Negative for abdominal pain, blood in stool, constipation and diarrhea.  Endocrine: Negative for polydipsia and polyuria.  Genitourinary:  Negative for dysuria, frequency and urgency.  Musculoskeletal:  Negative for arthralgias, back pain and myalgias.  Skin:  Negative for pallor and rash.  Allergic/Immunologic: Negative for environmental allergies.  Neurological:  Negative for dizziness, syncope and headaches.  Hematological:  Negative for adenopathy. Does not bruise/bleed easily.  Psychiatric/Behavioral:  Negative for decreased concentration and dysphoric mood. The patient is not nervous/anxious.       Objective:   Physical Exam Constitutional:      General: She is not in acute distress.    Appearance: Normal appearance. She is well-developed and normal weight. She is not ill-appearing or diaphoretic.  HENT:     Head: Normocephalic and atraumatic.     Right Ear: Tympanic membrane, ear canal and external ear normal.     Left Ear: Tympanic membrane, ear canal and external ear normal.     Nose: Nose normal. No congestion.     Mouth/Throat:     Mouth: Mucous membranes are moist.     Pharynx: Oropharynx is clear. No posterior oropharyngeal erythema.  Eyes:     General: No scleral icterus.    Extraocular Movements: Extraocular movements intact.     Conjunctiva/sclera: Conjunctivae normal.     Pupils: Pupils are equal, round, and reactive to light.     Comments: No thyroid eye changes  Neck:     Thyroid: No thyromegaly.     Vascular: No carotid bruit or JVD.     Comments: Baseline thyroid enlargement Cardiovascular:     Rate and Rhythm: Normal rate and regular rhythm.     Pulses: Normal pulses.     Heart  sounds: Normal heart sounds.    No gallop.  Pulmonary:     Effort: Pulmonary effort is normal. No respiratory distress.     Breath sounds: Normal breath sounds. No wheezing.     Comments: Good air exch Chest:     Chest wall: No tenderness.  Abdominal:     General: Bowel sounds are normal. There is no distension or abdominal bruit.     Palpations: Abdomen is soft. There is no mass.     Tenderness: There is no abdominal tenderness.     Hernia: No hernia is  present.  Genitourinary:    Comments: Gyn provider does breast and pelvic exam Musculoskeletal:        General: No tenderness. Normal range of motion.     Cervical back: Normal range of motion and neck supple. No rigidity. No muscular tenderness.     Right lower leg: No edema.     Left lower leg: No edema.  Lymphadenopathy:     Cervical: No cervical adenopathy.  Skin:    General: Skin is warm and dry.     Coloration: Skin is not pale.     Findings: No erythema or rash.     Comments: Some lentigines  Neurological:     Mental Status: She is alert. Mental status is at baseline.     Cranial Nerves: No cranial nerve deficit.     Motor: No abnormal muscle tone.     Coordination: Coordination normal.     Gait: Gait normal.     Deep Tendon Reflexes: Reflexes are normal and symmetric. Reflexes normal.  Psychiatric:        Mood and Affect: Mood normal.        Cognition and Memory: Cognition and memory normal.     Comments: Pleasant  cheerful          Assessment & Plan:   Problem List Items Addressed This Visit       Endocrine   Goiter    No change in exam today      Relevant Medications   SYNTHROID 50 MCG tablet   Hypothyroidism    Hypothyroidism  Pt has no clinical changes No change in energy level/ hair or skin/ edema and no tremor Lab Results  Component Value Date   TSH 1.22 03/18/2021     Taking levothyroxine 50 mcg daily  Will continue this        Relevant Medications   SYNTHROID 50 MCG tablet      Other   Prediabetes    a1c today  disc imp of low glycemic diet and wt loss to prevent DM2       Relevant Orders   Hemoglobin A1c (Completed)   Routine general medical examination at a health care facility - Primary    Reviewed health habits including diet and exercise and skin cancer prevention Reviewed appropriate screening tests for age  Also reviewed health mt list, fam hx and immunization status , as well as social and family history   See HPI Labs ordered and recent ones reviewed Sent for most recent pap from gyn Encouraged self breast exam Enc flu shot in the fall Td updated  Declines covid vaccine at this time       Relevant Orders   CBC with Differential/Platelet (Completed)   Comprehensive metabolic panel (Completed)   Lipid panel (Completed)   Other Visit Diagnoses     Need for Td vaccine       Relevant Orders   Td : Tetanus/diphtheria >7yo Preservative  free (Completed)

## 2021-05-16 NOTE — Assessment & Plan Note (Signed)
a1c today  disc imp of low glycemic diet and wt loss to prevent DM2  

## 2021-05-16 NOTE — Assessment & Plan Note (Signed)
Reviewed health habits including diet and exercise and skin cancer prevention Reviewed appropriate screening tests for age  Also reviewed health mt list, fam hx and immunization status , as well as social and family history   See HPI Labs ordered and recent ones reviewed Sent for most recent pap from gyn Encouraged self breast exam Enc flu shot in the fall Td updated  Declines covid vaccine at this time

## 2021-05-16 NOTE — Assessment & Plan Note (Signed)
No change in exam today 

## 2021-05-16 NOTE — Assessment & Plan Note (Signed)
Hypothyroidism  Pt has no clinical changes No change in energy level/ hair or skin/ edema and no tremor Lab Results  Component Value Date   TSH 1.22 03/18/2021     Taking levothyroxine 50 mcg daily  Will continue this

## 2021-05-17 ENCOUNTER — Encounter: Payer: Self-pay | Admitting: *Deleted

## 2021-10-07 ENCOUNTER — Telehealth (INDEPENDENT_AMBULATORY_CARE_PROVIDER_SITE_OTHER): Payer: BC Managed Care – PPO | Admitting: Family Medicine

## 2021-10-07 ENCOUNTER — Encounter: Payer: Self-pay | Admitting: Family Medicine

## 2021-10-07 ENCOUNTER — Other Ambulatory Visit: Payer: Self-pay

## 2021-10-07 DIAGNOSIS — J069 Acute upper respiratory infection, unspecified: Secondary | ICD-10-CM | POA: Diagnosis not present

## 2021-10-07 MED ORDER — PROMETHAZINE-DM 6.25-15 MG/5ML PO SYRP: 5.0000 mL | ORAL_SOLUTION | Freq: Every evening | ORAL | 0 refills | Status: DC | PRN

## 2021-10-07 MED ORDER — BENZONATATE 200 MG PO CAPS: 200.0000 mg | ORAL_CAPSULE | Freq: Three times a day (TID) | ORAL | 1 refills | Status: DC | PRN

## 2021-10-07 NOTE — Assessment & Plan Note (Signed)
2 weeks-now some post viral cough  Will try to break cough cycle Px tessalon tid and prometh DM qhs (caution of sedation) Update if not starting to improve in a week or if worsening

## 2021-10-07 NOTE — Progress Notes (Signed)
Virtual Visit via Video Note  I connected with Robin Pollard on 10/07/21 at 11:30 AM EST by a video enabled telemedicine application and verified that I am speaking with the correct person using two identifiers.  Location: Patient: home Provider: office    I discussed the limitations of evaluation and management by telemedicine and the availability of in person appointments. The patient expressed understanding and agreed to proceed.  Parties involved in encounter  Patient: Robin Pollard  Provider:  Roxy Manns MD   History of Present Illness: Pr presents with cough/congestion for 2 weeks   Runny nose and congestion 1 wk Cough for a week  Coughing all night long -not sleeping Mainly dry cough  No wheeze  No sob    No fever  Not feeling really sick  No headache  Some sinus pain at night- R side /R ear-better in am   Mucous is a little yellow  Was clear   Did not do a covid test  Sister had it too- she was negative   Otc: Cough med otc  Robitussin DM Delsym  Mucinex DM    Patient Active Problem List   Diagnosis Date Noted   Viral URI with cough 10/07/2021   Routine general medical examination at a health care facility 05/14/2021   Prediabetes 06/22/2020   Shortness of breath 01/30/2020   History of COVID-19 12/30/2019   Anemia, iron deficiency 08/30/2016   Screening for lipoid disorders 08/26/2015   Classic migraine 08/14/2015   Pre-employment examination 04/11/2011   Hypothyroidism 11/13/2009   Goiter 05/12/2008   Past Medical History:  Diagnosis Date   Classic migraine 08/14/2015   Goiter    Hashimoto's thyroiditis   H/O varicella    HPV (human papilloma virus) anogenital infection    not high risk   Hypothyroidism    Oral herpes    SVD (spontaneous vaginal delivery)    X 1   Past Surgical History:  Procedure Laterality Date   CYSTECTOMY     dermoid cyst   DILATION AND EVACUATION N/A 02/12/2015   Procedure: DILATATION AND EVACUATION WITH  ULTRASOUND GUIDANCE;  Surgeon: Marcelle Overlie, MD;  Location: [facility];  Service: Gynecology;  Laterality: N/A;   OOPHORECTOMY  2001   left   teratoma removal     WISDOM TOOTH EXTRACTION     Social History   Tobacco Use   Smoking status: Never   Smokeless tobacco: Never  Substance Use Topics   Alcohol use: No    Alcohol/week: 0.0 standard drinks   Drug use: No   Family History  Problem Relation Age of Onset   Urolithiasis Sister    Hypertension Mother    Phillips Odor White syndrome Mother    Urolithiasis Mother    Cancer Mother        breast also melanoma   Heart disease Maternal Grandmother        CAD   Diabetes Maternal Grandfather    Heart disease Paternal Grandfather        CAD   Hypertension Paternal Grandfather    Diabetes Paternal Grandfather    Hypertension Paternal Grandmother    Diabetes Paternal Grandmother    Heart disease Paternal Grandmother    Migraines Neg Hx    No Known Allergies Current Outpatient Medications on File Prior to Visit  Medication Sig Dispense Refill   SYNTHROID 50 MCG tablet Take 1 tablet (50 mcg total) by mouth daily. 90 tablet 3   No current facility-administered medications on  file prior to visit.   Review of Systems  Constitutional:  Negative for chills, fever and malaise/fatigue.  HENT:  Negative for congestion, ear pain, sinus pain and sore throat.   Eyes:  Negative for blurred vision, discharge and redness.  Respiratory:  Positive for cough. Negative for shortness of breath and stridor.   Cardiovascular:  Negative for chest pain, palpitations and leg swelling.  Gastrointestinal:  Negative for abdominal pain, diarrhea, nausea and vomiting.  Musculoskeletal:  Negative for myalgias.  Skin:  Negative for rash.  Neurological:  Negative for dizziness and headaches.   Observations/Objective: Patient appears well, in no distress Weight is baseline  No facial swelling or asymmetry Normal voice-not hoarse and no slurred  speech Sounds nasally congested No obvious tremor or mobility impairment Moving neck and UEs normally Able to hear the call well  No wheeze or shortness of breath during interview  Occ dry hacking cough Talkative and mentally sharp with no cognitive changes No skin changes on face or neck , no rash or pallor Affect is normal    Assessment and Plan: Problem List Items Addressed This Visit       Respiratory   Viral URI with cough    2 weeks-now some post viral cough  Will try to break cough cycle Px tessalon tid and prometh DM qhs (caution of sedation) Update if not starting to improve in a week or if worsening          Follow Up Instructions: I think you have a post viral cough syndrome  Drink fluids   Take tessalon every 8 hours  Add the prometh-DM cough syrup at night (watch out for sedation)  Watch for wheeze or shortness of breath  Watch for sinus pain or fever  Update if not starting to improve in a week or if worsening    I discussed the assessment and treatment plan with the patient. The patient was provided an opportunity to ask questions and all were answered. The patient agreed with the plan and demonstrated an understanding of the instructions.   The patient was advised to call back or seek an in-person evaluation if the symptoms worsen or if the condition fails to improve as anticipated.    Roxy Manns, MD

## 2021-10-07 NOTE — Patient Instructions (Signed)
I think you have a post viral cough syndrome  Drink fluids   Take tessalon every 8 hours  Add the prometh-DM cough syrup at night (watch out for sedation)  Watch for wheeze or shortness of breath  Watch for sinus pain or fever  Update if not starting to improve in a week or if worsening

## 2022-03-12 DIAGNOSIS — R5382 Chronic fatigue, unspecified: Secondary | ICD-10-CM | POA: Diagnosis not present

## 2022-03-12 DIAGNOSIS — E639 Nutritional deficiency, unspecified: Secondary | ICD-10-CM | POA: Diagnosis not present

## 2022-03-12 DIAGNOSIS — E663 Overweight: Secondary | ICD-10-CM | POA: Diagnosis not present

## 2022-03-12 DIAGNOSIS — E039 Hypothyroidism, unspecified: Secondary | ICD-10-CM | POA: Diagnosis not present

## 2022-03-12 DIAGNOSIS — Z713 Dietary counseling and surveillance: Secondary | ICD-10-CM | POA: Diagnosis not present

## 2022-03-19 DIAGNOSIS — Z713 Dietary counseling and surveillance: Secondary | ICD-10-CM | POA: Diagnosis not present

## 2022-03-19 DIAGNOSIS — E039 Hypothyroidism, unspecified: Secondary | ICD-10-CM | POA: Diagnosis not present

## 2022-03-19 DIAGNOSIS — E663 Overweight: Secondary | ICD-10-CM | POA: Diagnosis not present

## 2022-03-19 DIAGNOSIS — R5382 Chronic fatigue, unspecified: Secondary | ICD-10-CM | POA: Diagnosis not present

## 2022-03-26 DIAGNOSIS — E663 Overweight: Secondary | ICD-10-CM | POA: Diagnosis not present

## 2022-03-26 DIAGNOSIS — E039 Hypothyroidism, unspecified: Secondary | ICD-10-CM | POA: Diagnosis not present

## 2022-03-26 DIAGNOSIS — Z713 Dietary counseling and surveillance: Secondary | ICD-10-CM | POA: Diagnosis not present

## 2022-03-26 DIAGNOSIS — E639 Nutritional deficiency, unspecified: Secondary | ICD-10-CM | POA: Diagnosis not present

## 2022-03-26 DIAGNOSIS — R5382 Chronic fatigue, unspecified: Secondary | ICD-10-CM | POA: Diagnosis not present

## 2022-04-02 DIAGNOSIS — R5382 Chronic fatigue, unspecified: Secondary | ICD-10-CM | POA: Diagnosis not present

## 2022-04-02 DIAGNOSIS — E663 Overweight: Secondary | ICD-10-CM | POA: Diagnosis not present

## 2022-04-02 DIAGNOSIS — Z713 Dietary counseling and surveillance: Secondary | ICD-10-CM | POA: Diagnosis not present

## 2022-04-02 DIAGNOSIS — E639 Nutritional deficiency, unspecified: Secondary | ICD-10-CM | POA: Diagnosis not present

## 2022-04-02 DIAGNOSIS — E039 Hypothyroidism, unspecified: Secondary | ICD-10-CM | POA: Diagnosis not present

## 2022-04-09 DIAGNOSIS — R5382 Chronic fatigue, unspecified: Secondary | ICD-10-CM | POA: Diagnosis not present

## 2022-04-09 DIAGNOSIS — K5900 Constipation, unspecified: Secondary | ICD-10-CM | POA: Diagnosis not present

## 2022-04-09 DIAGNOSIS — E663 Overweight: Secondary | ICD-10-CM | POA: Diagnosis not present

## 2022-04-09 DIAGNOSIS — E039 Hypothyroidism, unspecified: Secondary | ICD-10-CM | POA: Diagnosis not present

## 2022-04-19 DIAGNOSIS — Z6828 Body mass index (BMI) 28.0-28.9, adult: Secondary | ICD-10-CM | POA: Diagnosis not present

## 2022-04-19 DIAGNOSIS — Z01419 Encounter for gynecological examination (general) (routine) without abnormal findings: Secondary | ICD-10-CM | POA: Diagnosis not present

## 2022-04-19 DIAGNOSIS — Z124 Encounter for screening for malignant neoplasm of cervix: Secondary | ICD-10-CM | POA: Diagnosis not present

## 2022-04-19 LAB — HM PAP SMEAR: HM Pap smear: NORMAL

## 2022-04-20 DIAGNOSIS — L578 Other skin changes due to chronic exposure to nonionizing radiation: Secondary | ICD-10-CM | POA: Diagnosis not present

## 2022-04-20 DIAGNOSIS — D229 Melanocytic nevi, unspecified: Secondary | ICD-10-CM | POA: Diagnosis not present

## 2022-05-23 ENCOUNTER — Other Ambulatory Visit: Payer: Self-pay | Admitting: Family Medicine

## 2022-06-09 ENCOUNTER — Encounter: Payer: BC Managed Care – PPO | Admitting: Family Medicine

## 2022-06-15 ENCOUNTER — Ambulatory Visit (INDEPENDENT_AMBULATORY_CARE_PROVIDER_SITE_OTHER): Payer: BC Managed Care – PPO | Admitting: Family Medicine

## 2022-06-15 ENCOUNTER — Encounter: Payer: Self-pay | Admitting: Family Medicine

## 2022-06-15 VITALS — BP 122/70 | HR 76 | Temp 97.7°F | Ht 66.5 in | Wt 173.4 lb

## 2022-06-15 DIAGNOSIS — E049 Nontoxic goiter, unspecified: Secondary | ICD-10-CM | POA: Diagnosis not present

## 2022-06-15 DIAGNOSIS — Z13220 Encounter for screening for lipoid disorders: Secondary | ICD-10-CM

## 2022-06-15 DIAGNOSIS — R7303 Prediabetes: Secondary | ICD-10-CM | POA: Diagnosis not present

## 2022-06-15 DIAGNOSIS — E039 Hypothyroidism, unspecified: Secondary | ICD-10-CM

## 2022-06-15 DIAGNOSIS — Z0000 Encounter for general adult medical examination without abnormal findings: Secondary | ICD-10-CM | POA: Diagnosis not present

## 2022-06-15 NOTE — Assessment & Plan Note (Signed)
With h/o hashimoto's in the past   TSH ordered Taking levothyroxine 50 mcg daily  Doing well  Will change dose if needed based on labs -otherwise refill for the year

## 2022-06-15 NOTE — Assessment & Plan Note (Addendum)
Reviewed health habits including diet and exercise and skin cancer prevention Reviewed appropriate screening tests for age  Also reviewed health mt list, fam hx and immunization status , as well as social and family history   See HPI Labs ordered  Pap and gyn visit utd from July and nl breast exam  Declines flu shot and covid shot  Good health habits- enc her to keep up the exercise  utd derm and dental and eye care

## 2022-06-15 NOTE — Patient Instructions (Addendum)
Try to get most of your carbohydrates from produce (with the exception of white potatoes)  Eat less bread/pasta/rice/snack foods/cereals/sweets and other items from the middle of the grocery store (processed carbs)    Lab today   Take care of yourself ! Eat a healthy diet  Keep exercising   Think about a flu shot this fall

## 2022-06-15 NOTE — Assessment & Plan Note (Signed)
No change in exam No swallowing problems or clinical changes

## 2022-06-15 NOTE — Progress Notes (Signed)
Subjective:    Patient ID: Robin Pollard, female    DOB: [birth year], 31 y.o.   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  Works at [employer] in [employer] - will work from home and more $  A big change and likes it so far  This allows her to pick up kids daily -happy with that   Feeling ok  Taking care of herself  Had to get caught up on dentist and other appts and derm     Immunization History  Administered Date(s) Administered   Hpv-Unspecified 03/30/2007   Influenza Whole 09/11/2007, 07/08/2008   Influenza,inj,Quad PF,6+ Mos 08/06/2014, 08/26/2015, 12/18/2017   Influenza-Unspecified 06/04/2016   MMR 09/03/2012   Meningococcal Polysaccharide 01/17/2007   PPD Test 04/11/2011   Td 06/13/2002, 05/14/2021   Tdap 04/11/2011   Health Maintenance Due  Topic Date Due   Hepatitis C Screening  Never done   HPV VACCINES (2 - 3-dose series) 04/27/2007   INFLUENZA VACCINE  05/03/2022   Flu shot -declines  Declines covid vaccines in the past   [facility]   Pap 01/2021 [facility] with neg hpv screen, went this year  Had hpv vaccine  Pap was normal this year -need to send for that - 04/19/22   Self breast exam:  no lumps   H/o goiter in the past with hashimoto's  Hypothyroidism  Pt has no clinical changes No change in energy level/ hair or skin/ edema and no tremor Lab Results  Component Value Date   TSH 1.22 03/18/2021   Levothyroxine 50 mcg daily   No missed doses   Prediabetes Lab Results  Component Value Date   HGBA1C 5.3 05/14/2021   Due for labs   She was going to [facility] weight [facility] / she had to stop going because the clinic had some problems  Ate a lot of protein and vegetables and healthy fats  and did really well   Exercise - is getting more time to exercise with new job Running and weights   Planning to get the treadmill desk     Patient Active Problem List   Diagnosis Date Noted   Routine general medical examination at a health care facility 05/14/2021   Prediabetes 06/22/2020   History of COVID-19 12/30/2019   Screening for lipoid disorders 08/26/2015   Classic migraine 08/14/2015   Pre-employment examination 04/11/2011   Hypothyroidism 11/13/2009   Goiter 05/12/2008   Past Medical History:  Diagnosis Date   Classic migraine 08/14/2015   Goiter    Hashimoto's thyroiditis   H/O varicella    HPV (human papilloma virus) anogenital infection    not high risk   Hypothyroidism    Oral herpes    SVD (spontaneous vaginal delivery)    X 1   Past Surgical History:  Procedure Laterality Date   CYSTECTOMY     dermoid cyst   DILATION AND EVACUATION N/A 02/12/2015   Procedure: DILATATION AND EVACUATION WITH ULTRASOUND GUIDANCE;  Surgeon: Dian Queen, MD;  Location: [facility];  Service: Gynecology;  Laterality: N/A;   OOPHORECTOMY  2001   left   teratoma removal  WISDOM TOOTH EXTRACTION     Social History   Tobacco Use   Smoking status: Never   Smokeless tobacco: Never  Substance Use Topics   Alcohol use: No    Alcohol/week: 0.0 standard drinks of alcohol   Drug use: No   Family History  Problem Relation Age of Onset   Urolithiasis Sister    Hypertension Mother    Delorse Limber White syndrome Mother    Urolithiasis Mother    Cancer Mother        breast also melanoma   Heart disease Maternal Grandmother        CAD   Diabetes Maternal Grandfather    Heart disease Paternal Grandfather        CAD   Hypertension Paternal Grandfather    Diabetes Paternal Grandfather    Hypertension Paternal Grandmother    Diabetes Paternal Grandmother    Heart disease Paternal Grandmother    Migraines Neg Hx    No Known Allergies Current Outpatient Medications  on File Prior to Visit  Medication Sig Dispense Refill   SYNTHROID 50 MCG tablet TAKE 1 TABLET BY MOUTH EVERY DAY 90 tablet 0   No current facility-administered medications on file prior to visit.     Review of Systems  Constitutional:  Negative for activity change, appetite change, fatigue, fever and unexpected weight change.  HENT:  Negative for congestion, ear pain, rhinorrhea, sinus pressure and sore throat.   Eyes:  Negative for pain, redness and visual disturbance.  Respiratory:  Negative for cough, shortness of breath and wheezing.   Cardiovascular:  Negative for chest pain and palpitations.  Gastrointestinal:  Negative for abdominal pain, blood in stool, constipation and diarrhea.  Endocrine: Negative for polydipsia and polyuria.  Genitourinary:  Negative for dysuria, frequency and urgency.  Musculoskeletal:  Negative for arthralgias, back pain and myalgias.  Skin:  Negative for pallor and rash.  Allergic/Immunologic: Negative for environmental allergies.  Neurological:  Negative for dizziness, syncope and headaches.  Hematological:  Negative for adenopathy. Does not bruise/bleed easily.  Psychiatric/Behavioral:  Negative for decreased concentration and dysphoric mood. The patient is not nervous/anxious.        Objective:   Physical Exam Constitutional:      General: She is not in acute distress.    Appearance: Normal appearance. She is well-developed and normal weight. She is not ill-appearing or diaphoretic.  HENT:     Head: Normocephalic and atraumatic.     Right Ear: Tympanic membrane, ear canal and external ear normal.     Left Ear: Tympanic membrane, ear canal and external ear normal.     Nose: Nose normal. No congestion.     Mouth/Throat:     Mouth: Mucous membranes are moist.     Pharynx: Oropharynx is clear. No posterior oropharyngeal erythema.  Eyes:     General: No scleral icterus.    Extraocular Movements: Extraocular movements intact.      Conjunctiva/sclera: Conjunctivae normal.     Pupils: Pupils are equal, round, and reactive to light.  Neck:     Thyroid: No thyromegaly.     Vascular: No carotid bruit or JVD.  Cardiovascular:     Rate and Rhythm: Normal rate and regular rhythm.     Pulses: Normal pulses.     Heart sounds: Normal heart sounds.     No gallop.  Pulmonary:     Effort: Pulmonary effort is normal. No respiratory distress.     Breath sounds: Normal breath sounds. No wheezing.  Comments: Good air exch Chest:     Chest wall: No tenderness.  Abdominal:     General: Bowel sounds are normal. There is no distension or abdominal bruit.     Palpations: Abdomen is soft. There is no mass.     Tenderness: There is no abdominal tenderness.     Hernia: No hernia is present.  Genitourinary:    Comments: Breast and pelvic exam done by gyn in july Musculoskeletal:        General: No tenderness. Normal range of motion.     Cervical back: Normal range of motion and neck supple. No rigidity. No muscular tenderness.     Right lower leg: No edema.     Left lower leg: No edema.     Comments: No kyphosis   Lymphadenopathy:     Cervical: No cervical adenopathy.  Skin:    General: Skin is warm and dry.     Coloration: Skin is not pale.     Findings: No erythema or rash.     Comments: Solar lentigines diffusely   Neurological:     Mental Status: She is alert. Mental status is at baseline.     Cranial Nerves: No cranial nerve deficit.     Motor: No abnormal muscle tone.     Coordination: Coordination normal.     Gait: Gait normal.     Deep Tendon Reflexes: Reflexes are normal and symmetric. Reflexes normal.  Psychiatric:        Mood and Affect: Mood normal.        Cognition and Memory: Cognition and memory normal.     Comments: Pleasant and cheerful            Assessment & Plan:   Problem List Items Addressed This Visit       Endocrine   Goiter    No change in exam No swallowing problems or clinical  changes       Hypothyroidism    With h/o hashimoto's in the past   TSH ordered Taking levothyroxine 50 mcg daily  Doing well  Will change dose if needed based on labs -otherwise refill for the year       Relevant Orders   TSH     Other   Prediabetes    a1c ordered Eating low glycemic (did go to a weight clinic for a while also) Getting back to regular exercise   disc imp of low glycemic diet and wt loss to prevent DM2       Relevant Orders   Hemoglobin A1c   Routine general medical examination at a health care facility - Primary    Reviewed health habits including diet and exercise and skin cancer prevention Reviewed appropriate screening tests for age  Also reviewed health mt list, fam hx and immunization status , as well as social and family history   See HPI Labs ordered  Pap and gyn visit utd from July and nl breast exam  Declines flu shot and covid shot  Good health habits- enc her to keep up the exercise  utd derm and dental and eye care       Relevant Orders   TSH   Lipid panel   Comprehensive metabolic panel   CBC with Differential/Platelet   Screening for lipoid disorders    Lipid panel today  Diet has been fairly good

## 2022-06-15 NOTE — Assessment & Plan Note (Signed)
Lipid panel today  Diet has been fairly good

## 2022-06-15 NOTE — Assessment & Plan Note (Signed)
a1c ordered Eating low glycemic (did go to a weight clinic for a while also) Getting back to regular exercise   disc imp of low glycemic diet and wt loss to prevent DM2

## 2022-06-16 LAB — COMPREHENSIVE METABOLIC PANEL
ALT: 9 U/L (ref 0–35)
AST: 12 U/L (ref 0–37)
Albumin: 4.2 g/dL (ref 3.5–5.2)
Alkaline Phosphatase: 45 U/L (ref 39–117)
BUN: 18 mg/dL (ref 6–23)
CO2: 25 mEq/L (ref 19–32)
Calcium: 9.3 mg/dL (ref 8.4–10.5)
Chloride: 103 mEq/L (ref 96–112)
Creatinine, Ser: 0.77 mg/dL (ref 0.40–1.20)
GFR: 101.18 mL/min (ref >60.00)
Glucose, Bld: 77 mg/dL (ref 70–99)
Potassium: 4 mEq/L (ref 3.5–5.1)
Sodium: 137 mEq/L (ref 135–145)
Total Bilirubin: 0.5 mg/dL (ref 0.2–1.2)
Total Protein: 7.1 g/dL (ref 6.0–8.3)

## 2022-06-16 LAB — TSH: TSH: 0.91 u[IU]/mL (ref 0.35–5.50)

## 2022-06-16 LAB — LIPID PANEL
Cholesterol: 179 mg/dL (ref 0–200)
HDL: 65.6 mg/dL (ref >39.00)
LDL Cholesterol: 100 mg/dL — ABNORMAL HIGH (ref 0–99)
NonHDL: 113.41
Total CHOL/HDL Ratio: 3
Triglycerides: 67 mg/dL (ref 0.0–149.0)
VLDL: 13.4 mg/dL (ref 0.0–40.0)

## 2022-06-16 LAB — CBC WITH DIFFERENTIAL/PLATELET
Basophils Absolute: 0 10*3/uL (ref 0.0–0.1)
Basophils Relative: 0.6 % (ref 0.0–3.0)
Eosinophils Absolute: 0.1 10*3/uL (ref 0.0–0.7)
Eosinophils Relative: 2 % (ref 0.0–5.0)
HCT: 38.7 % (ref 36.0–46.0)
Hemoglobin: 13.1 g/dL (ref 12.0–15.0)
Lymphocytes Relative: 26.8 % (ref 12.0–46.0)
Lymphs Abs: 1.8 10*3/uL (ref 0.7–4.0)
MCHC: 33.8 g/dL (ref 30.0–36.0)
MCV: 91.1 fl (ref 78.0–100.0)
Monocytes Absolute: 0.6 10*3/uL (ref 0.1–1.0)
Monocytes Relative: 9.1 % (ref 3.0–12.0)
Neutro Abs: 4.1 10*3/uL (ref 1.4–7.7)
Neutrophils Relative %: 61.5 % (ref 43.0–77.0)
Platelets: 237 10*3/uL (ref 150.0–400.0)
RBC: 4.25 Mil/uL (ref 3.87–5.11)
RDW: 12.6 % (ref 11.5–15.5)
WBC: 6.6 10*3/uL (ref 4.0–10.5)

## 2022-06-16 LAB — HEMOGLOBIN A1C: Hgb A1c MFr Bld: 5.5 % (ref 4.6–6.5)

## 2022-08-22 ENCOUNTER — Other Ambulatory Visit: Payer: Self-pay | Admitting: Family Medicine

## 2023-03-10 ENCOUNTER — Encounter: Payer: Self-pay | Admitting: Family Medicine

## 2023-03-10 ENCOUNTER — Ambulatory Visit (INDEPENDENT_AMBULATORY_CARE_PROVIDER_SITE_OTHER): Payer: BC Managed Care – PPO | Admitting: Family Medicine

## 2023-03-10 VITALS — BP 114/80 | HR 73 | Temp 97.8°F | Ht 66.5 in | Wt 167.0 lb

## 2023-03-10 DIAGNOSIS — G43111 Migraine with aura, intractable, with status migrainosus: Secondary | ICD-10-CM

## 2023-03-10 MED ORDER — SUMATRIPTAN SUCCINATE 100 MG PO TABS: ORAL_TABLET | ORAL | 3 refills | Status: AC

## 2023-03-10 NOTE — Patient Instructions (Addendum)
Start weaning caffeine slowly until you get off of it  Then only drink caffeine when you have a headache   Also increase fluids to 64 oz per day   Use imitrex as needed  Ice/cold compress to the head is helpful also   Take care of yourself    We will send the paperwork in

## 2023-03-10 NOTE — Progress Notes (Signed)
Subjective:    Patient ID: Robin Pollard, female    DOB: 25-Aug-1991, 32 y.o.   MRN: [id number]  HPI Pt presents for f/u of migraines and paper work  [employer] Readings from Last 3 Encounters:  03/10/23 167 lb (75.8 kg)  06/15/22 173 lb 6 oz (78.6 kg)  05/14/21 168 lb 9 oz (76.5 kg)   26.55 kg/m  Vitals:   03/10/23 1220  BP: 114/80  Pulse: 73  Temp: 97.8 F (36.6 C)  SpO2: 100%     Last saw neuro for migraine sin 2016  Dr Anne Hahn  Imitrex in past Prednisone in past  Got less frequent   Needs accommodations  Works from home   She is supposed to go in 4 days out of the month  Migraines are very triggered by the work lights (bright fluorescent and cannot turn them off)  Work place did not have a problem letting her work at home   Is anxious to go in and get a migraine   Migraine headaches  Migraines are twice per month  3-4 hours average  Triggered by bright lights , long drive , loud noise and stress  Some foods  Getting up too early / sleep changes  Has a good routine at home l  Starts usually on L side and behind eye- sometimes travels to the back  Throbbing  Usually get 6-7/10 on pain scale   (worse in the past) Some n/v  Some aura in the same eye before the headache stops   Gets tingling in fingers and toes Hard to talk and function   Does drink caffeine   She tries excedrin migraine - does not always work Tylenol Nsaid- ibuprofen   Imitrex in the past -needs refill   Patient Active Problem List   Diagnosis Date Noted   Routine general medical examination at a health care facility 05/14/2021   Prediabetes 06/22/2020   History of COVID-19 12/30/2019   Screening for lipoid disorders 08/26/2015   Classic migraine 08/14/2015   Pre-employment examination 04/11/2011   Hypothyroidism 11/13/2009   Goiter 05/12/2008   Past Medical History:  Diagnosis Date   Classic migraine 08/14/2015   Goiter    Hashimoto's thyroiditis   H/O varicella    HPV  (human papilloma virus) anogenital infection    not high risk   Hypothyroidism    Oral herpes    SVD (spontaneous vaginal delivery)    X 1   Past Surgical History:  Procedure Laterality Date   CYSTECTOMY     dermoid cyst   DILATION AND EVACUATION N/A 02/12/2015   Procedure: DILATATION AND EVACUATION WITH ULTRASOUND GUIDANCE;  Surgeon: Marcelle Overlie, MD;  Location: [facility];  Service: Gynecology;  Laterality: N/A;   OOPHORECTOMY  2001   left   teratoma removal     WISDOM TOOTH EXTRACTION     Social History   Tobacco Use   Smoking status: Never   Smokeless tobacco: Never  Substance Use Topics   Alcohol use: No    Alcohol/week: 0.0 standard drinks of alcohol   Drug use: No   Family History  Problem Relation Age of Onset   Urolithiasis Sister    Hypertension Mother    [place]ill[place] Odor White syndrome Mother    Urolithiasis Mother    Cancer Mother        breast also melanoma   Heart disease Maternal Grandmother        CAD   Diabetes Maternal Grandfather  Heart disease Paternal Grandfather        CAD   Hypertension Paternal Grandfather    Diabetes Paternal Grandfather    Hypertension Paternal Grandmother    Diabetes Paternal Grandmother    Heart disease Paternal Grandmother    Migraines Neg Hx    No Known Allergies Current Outpatient Medications on File Prior to Visit  Medication Sig Dispense Refill   SYNTHROID 50 MCG tablet Take 1 tablet (50 mcg total) by mouth daily before breakfast. 90 tablet 2   No current facility-administered medications on file prior to visit.      Review of Systems  Constitutional:  Negative for activity change, appetite change, fatigue, fever and unexpected weight change.  HENT:  Negative for congestion, ear pain, rhinorrhea, sinus pressure and sore throat.   Eyes:  Negative for pain, redness and visual disturbance.  Respiratory:  Negative for cough, shortness of breath and wheezing.   Cardiovascular:  Negative for chest pain and  palpitations.  Gastrointestinal:  Negative for abdominal pain, blood in stool, constipation and diarrhea.  Endocrine: Negative for polydipsia and polyuria.  Genitourinary:  Negative for dysuria, frequency and urgency.  Musculoskeletal:  Negative for arthralgias, back pain and myalgias.  Skin:  Negative for pallor and rash.  Allergic/Immunologic: Negative for environmental allergies.  Neurological:  Positive for headaches. Negative for dizziness and syncope.       Tingling in fingers/toes with headache    Hematological:  Negative for adenopathy. Does not bruise/bleed easily.  Psychiatric/Behavioral:  Negative for decreased concentration and dysphoric mood. The patient is not nervous/anxious.        Objective:   Physical Exam Constitutional:      General: She is not in acute distress.    Appearance: Normal appearance. She is well-developed and normal weight. She is not ill-appearing or diaphoretic.  HENT:     Head: Normocephalic and atraumatic.     Right Ear: External ear normal.     Left Ear: External ear normal.     Nose: Nose normal.     Mouth/Throat:     Pharynx: No oropharyngeal exudate.  Eyes:     General: No scleral icterus.       Right eye: No discharge.        Left eye: No discharge.     Conjunctiva/sclera: Conjunctivae normal.     Pupils: Pupils are equal, round, and reactive to light.     Comments: No nystagmus  Neck:     Thyroid: No thyromegaly.     Vascular: No carotid bruit or JVD.     Trachea: No tracheal deviation.  Cardiovascular:     Rate and Rhythm: Normal rate and regular rhythm.     Heart sounds: Normal heart sounds. No murmur heard. Pulmonary:     Effort: Pulmonary effort is normal. No respiratory distress.     Breath sounds: Normal breath sounds. No wheezing or rales.  Abdominal:     General: Bowel sounds are normal. There is no distension.     Palpations: Abdomen is soft. There is no mass.     Tenderness: There is no abdominal tenderness.   Musculoskeletal:        General: No tenderness.     Cervical back: Full passive range of motion without pain, normal range of motion and neck supple.  Lymphadenopathy:     Cervical: No cervical adenopathy.  Skin:    General: Skin is warm and dry.     Coloration: Skin is not pale.  Findings: No rash.  Neurological:     Mental Status: She is alert and oriented to person, place, and time.     Cranial Nerves: No cranial nerve deficit.     Sensory: No sensory deficit.     Motor: No tremor, atrophy or abnormal muscle tone.     Coordination: Coordination normal.     Gait: Gait normal.     Deep Tendon Reflexes: Reflexes are normal and symmetric. Reflexes normal.     Comments: No focal cerebellar signs   Psychiatric:        Behavior: Behavior normal.        Thought Content: Thought content normal.           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

## 2023-03-10 NOTE — Assessment & Plan Note (Addendum)
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

## 2023-03-26 ENCOUNTER — Other Ambulatory Visit: Payer: Self-pay | Admitting: Family Medicine

## 2023-06-23 ENCOUNTER — Other Ambulatory Visit: Payer: Self-pay | Admitting: Family Medicine

## 2023-06-23 NOTE — Telephone Encounter (Signed)
Patient has been scheduled

## 2023-06-23 NOTE — Telephone Encounter (Signed)
Pt's overdue for her CPE (labs prior), please schedule and then route back to me. thanks

## 2023-06-23 NOTE — Telephone Encounter (Signed)
Lvm for patient tcb and schedule

## 2023-07-18 ENCOUNTER — Encounter: Payer: Self-pay | Admitting: Family Medicine

## 2023-07-18 ENCOUNTER — Ambulatory Visit (INDEPENDENT_AMBULATORY_CARE_PROVIDER_SITE_OTHER): Payer: BC Managed Care – PPO | Admitting: Family Medicine

## 2023-07-18 VITALS — BP 102/70 | HR 64 | Temp 98.4°F | Ht 66.25 in | Wt 172.0 lb

## 2023-07-18 DIAGNOSIS — E039 Hypothyroidism, unspecified: Secondary | ICD-10-CM

## 2023-07-18 DIAGNOSIS — Z0000 Encounter for general adult medical examination without abnormal findings: Secondary | ICD-10-CM | POA: Diagnosis not present

## 2023-07-18 DIAGNOSIS — Z13220 Encounter for screening for lipoid disorders: Secondary | ICD-10-CM

## 2023-07-18 DIAGNOSIS — E049 Nontoxic goiter, unspecified: Secondary | ICD-10-CM | POA: Diagnosis not present

## 2023-07-18 DIAGNOSIS — R7303 Prediabetes: Secondary | ICD-10-CM

## 2023-07-18 LAB — CBC WITH DIFFERENTIAL/PLATELET
Basophils Absolute: 0 10*3/uL (ref 0.0–0.1)
Basophils Relative: 0.6 % (ref 0.0–3.0)
Eosinophils Absolute: 0.1 10*3/uL (ref 0.0–0.7)
Eosinophils Relative: 1.3 % (ref 0.0–5.0)
HCT: 41.1 % (ref 36.0–46.0)
Hemoglobin: 13.4 g/dL (ref 12.0–15.0)
Lymphocytes Relative: 29.8 % (ref 12.0–46.0)
Lymphs Abs: 1.9 10*3/uL (ref 0.7–4.0)
MCHC: 32.7 g/dL (ref 30.0–36.0)
MCV: 90.5 fL (ref 78.0–100.0)
Monocytes Absolute: 0.7 10*3/uL (ref 0.1–1.0)
Monocytes Relative: 11 % (ref 3.0–12.0)
Neutro Abs: 3.6 10*3/uL (ref 1.4–7.7)
Neutrophils Relative %: 57.3 % (ref 43.0–77.0)
Platelets: 234 10*3/uL (ref 150.0–400.0)
RBC: 4.54 Mil/uL (ref 3.87–5.11)
RDW: 12.5 % (ref 11.5–15.5)
WBC: 6.3 10*3/uL (ref 4.0–10.5)

## 2023-07-18 LAB — COMPREHENSIVE METABOLIC PANEL
ALT: 10 U/L (ref 0–35)
AST: 15 U/L (ref 0–37)
Albumin: 4.2 g/dL (ref 3.5–5.2)
Alkaline Phosphatase: 44 U/L (ref 39–117)
BUN: 22 mg/dL (ref 6–23)
CO2: 27 meq/L (ref 19–32)
Calcium: 9.5 mg/dL (ref 8.4–10.5)
Chloride: 101 meq/L (ref 96–112)
Creatinine, Ser: 0.74 mg/dL (ref 0.40–1.20)
GFR: 105.32 mL/min (ref >60.00)
Glucose, Bld: 88 mg/dL (ref 70–99)
Potassium: 4 meq/L (ref 3.5–5.1)
Sodium: 135 meq/L (ref 135–145)
Total Bilirubin: 0.6 mg/dL (ref 0.2–1.2)
Total Protein: 7 g/dL (ref 6.0–8.3)

## 2023-07-18 LAB — LIPID PANEL
Cholesterol: 173 mg/dL (ref 0–200)
HDL: 62.3 mg/dL (ref >39.00)
LDL Cholesterol: 95 mg/dL (ref 0–99)
NonHDL: 110.77
Total CHOL/HDL Ratio: 3
Triglycerides: 77 mg/dL (ref 0.0–149.0)
VLDL: 15.4 mg/dL (ref 0.0–40.0)

## 2023-07-18 LAB — HEMOGLOBIN A1C: Hgb A1c MFr Bld: 5.3 % (ref 4.6–6.5)

## 2023-07-18 LAB — TSH: TSH: 0.99 u[IU]/mL (ref 0.35–5.50)

## 2023-07-18 NOTE — Assessment & Plan Note (Addendum)
TSH today  Taking levothyroxine daily   No clinical changes  Past history of hashimotis and stable goiter

## 2023-07-18 NOTE — Patient Instructions (Addendum)
Make your appointment with gyn for annual exam   See your dermatologist   Keep running Add some strength training to your routine, this is important for bone and brain health and can reduce your risk of falls and help your body use insulin properly and regulate weight  Light weights, exercise bands , and internet videos are a good way to start  Yoga (chair or regular), machines , floor exercises or a gym with machines are also good options   Take care of yourself   Labs today

## 2023-07-18 NOTE — Assessment & Plan Note (Signed)
Lipid panel today with wellness labs

## 2023-07-18 NOTE — Assessment & Plan Note (Signed)
A1c today  Trying to eat well  Running for exercise   disc imp of low glycemic diet and wt loss to prevent DM2

## 2023-07-18 NOTE — Assessment & Plan Note (Signed)
History of hashimotis in past  No clinical changes No change in exam  No swallowing complaints  Continues levothyroxine

## 2023-07-18 NOTE — Progress Notes (Signed)
Subjective:    Patient ID: Robin Pollard, female    DOB: Sep 13, 1991, 32 y.o.   MRN: [id number]  HPI  Here for health maintenance exam and to review chronic medical problems   Wt Readings from Last 3 Encounters:  07/18/23 172 lb (78 kg)  03/10/23 167 lb (75.8 kg)  06/15/22 173 lb 6 oz (78.6 kg)   27.55 kg/m  Vitals:   07/18/23 1122  BP: 102/70  Pulse: 64  Temp: 98.4 F (36.9 C)  SpO2: 100%    Immunization History  Administered Date(s) Administered   Hpv-Unspecified 03/30/2007   Influenza Whole 09/11/2007, 07/08/2008   Influenza,inj,Quad PF,6+ Mos 08/06/2014, 08/26/2015, 12/18/2017   Influenza-Unspecified 06/04/2016   MMR 09/03/2012   Meningococcal polysaccharide vaccine (MPSV4) 01/17/2007   PPD Test 04/11/2011   Td 06/13/2002, 05/14/2021   Tdap 04/11/2011    Health Maintenance Due  Topic Date Due   Hepatitis C Screening  Never done   Her kids are in 2 sports  Loves it  Very busy   Taking care of herself    Declines flu shot   Self breast exam- no lumps  Mother had breast cancer - in 80s    Gyn health Pap 04/2022 normal at gyn  Is overdue  Dr Vincente Poli   Menses -normal  No current contraception  Considering vasectomy    Colon cancer screening -no colon cancer in the family   Bone health   Falls-none Fractures-none  Supplements -occational takes mvi  Exercise :  Likes to run  - 3 times per week  Wants to add some strength training   Derm care= yearly  Has appointment in November  Mother had melanoma  Needs to be better about sun protection  No longer goes to tanning beds     Mood    07/18/2023   11:26 AM 03/10/2023   12:27 PM 06/15/2022    4:19 PM 05/14/2021   11:47 AM 02/18/2020    4:08 PM  Depression screen PHQ 2/9  Decreased Interest 0 0 0 0 0  Down, Depressed, Hopeless 0 0 0 0 0  PHQ - 2 Score 0 0 0 0 0  Altered sleeping 1 0 0 1   Tired, decreased energy 1 0 0 1   Change in appetite 1 0 0 1   Feeling bad or failure about  yourself  0 0 0 0   Trouble concentrating 0 0 0 0   Moving slowly or fidgety/restless 0 0 0 0   Suicidal thoughts 0 0 0 0   PHQ-9 Score 3 0 0 3   Difficult doing work/chores Not difficult at all Not difficult at all Not difficult at all     Hypothyroidism with history of goiter and Hashimotos in past  Pt has no clinical changes No change in energy level/ hair or skin/ edema and no tremor Lab Results  Component Value Date   TSH 0.91 06/15/2022    Levothyroxine 50 mcg    Prediabetes in past  Lab Results  Component Value Date   HGBA1C 5.5 06/15/2022   Due for labs   Patient Active Problem List   Diagnosis Date Noted   Routine general medical examination at a health care facility 05/14/2021   Prediabetes 06/22/2020   Screening for lipoid disorders 08/26/2015   Classic migraine 08/14/2015   Hypothyroidism 11/13/2009   Goiter 05/12/2008   Past Medical History:  Diagnosis Date   Classic migraine 08/14/2015   Goiter    Hashimoto's  thyroiditis   H/O varicella    HPV (human papilloma virus) anogenital infection    not high risk   Hypothyroidism    Oral herpes    SVD (spontaneous vaginal delivery)    X 1   Past Surgical History:  Procedure Laterality Date   CYSTECTOMY     dermoid cyst   DILATION AND EVACUATION N/A 02/12/2015   Procedure: DILATATION AND EVACUATION WITH ULTRASOUND GUIDANCE;  Surgeon: Marcelle Overlie, MD;  Location: [facility];  Service: Gynecology;  Laterality: N/A;   OOPHORECTOMY  2001   left   teratoma removal     WISDOM TOOTH EXTRACTION     Social History   Tobacco Use   Smoking status: Never   Smokeless tobacco: Never  Substance Use Topics   Alcohol use: No    Alcohol/week: 0.0 standard drinks of alcohol   Drug use: No   Family History  Problem Relation Age of Onset   Urolithiasis Sister    Hypertension Mother    Phillips Odor White syndrome Mother    Urolithiasis Mother    Cancer Mother        breast also melanoma   Heart disease  Maternal Grandmother        CAD   Diabetes Maternal Grandfather    Heart disease Paternal Grandfather        CAD   Hypertension Paternal Grandfather    Diabetes Paternal Grandfather    Hypertension Paternal Grandmother    Diabetes Paternal Grandmother    Heart disease Paternal Grandmother    Migraines Neg Hx    No Known Allergies Current Outpatient Medications on File Prior to Visit  Medication Sig Dispense Refill   SUMAtriptan (IMITREX) 100 MG tablet Take one pill by mouth as needed for migraine (maximum one daily) 10 tablet 3   SYNTHROID 50 MCG tablet TAKE 1 TABLET BY MOUTH EVERY DAY BEFORE BREAKFAST 90 tablet 0   No current facility-administered medications on file prior to visit.    Review of Systems  Constitutional:  Negative for activity change, appetite change, fatigue, fever and unexpected weight change.  HENT:  Negative for congestion, ear pain, rhinorrhea, sinus pressure and sore throat.   Eyes:  Negative for pain, redness and visual disturbance.  Respiratory:  Negative for cough, shortness of breath and wheezing.   Cardiovascular:  Negative for chest pain and palpitations.  Gastrointestinal:  Negative for abdominal pain, blood in stool, constipation and diarrhea.  Endocrine: Negative for polydipsia and polyuria.  Genitourinary:  Negative for dysuria, frequency and urgency.  Musculoskeletal:  Negative for arthralgias, back pain and myalgias.  Skin:  Negative for pallor and rash.  Allergic/Immunologic: Negative for environmental allergies.  Neurological:  Negative for dizziness, syncope and headaches.  Hematological:  Negative for adenopathy. Does not bruise/bleed easily.  Psychiatric/Behavioral:  Negative for decreased concentration and dysphoric mood. The patient is not nervous/anxious.        Objective:   Physical Exam Constitutional:      General: She is not in acute distress.    Appearance: Normal appearance. She is well-developed and normal weight. She is not  ill-appearing or diaphoretic.  HENT:     Head: Normocephalic and atraumatic.     Right Ear: Tympanic membrane, ear canal and external ear normal.     Left Ear: Tympanic membrane, ear canal and external ear normal.     Nose: Nose normal. No congestion.     Mouth/Throat:     Mouth: Mucous membranes are moist.  Pharynx: Oropharynx is clear. No posterior oropharyngeal erythema.  Eyes:     General: No scleral icterus.    Extraocular Movements: Extraocular movements intact.     Conjunctiva/sclera: Conjunctivae normal.     Pupils: Pupils are equal, round, and reactive to light.  Neck:     Thyroid: No thyromegaly.     Vascular: No carotid bruit or JVD.  Cardiovascular:     Rate and Rhythm: Normal rate and regular rhythm.     Pulses: Normal pulses.     Heart sounds: Normal heart sounds.     No gallop.  Pulmonary:     Effort: Pulmonary effort is normal. No respiratory distress.     Breath sounds: Normal breath sounds. No wheezing.     Comments: Good air exch Chest:     Chest wall: No tenderness.  Abdominal:     General: Bowel sounds are normal. There is no distension or abdominal bruit.     Palpations: Abdomen is soft. There is no mass.     Tenderness: There is no abdominal tenderness.     Hernia: No hernia is present.  Genitourinary:    Comments: Breast and pelvic exam are done by gyn provider   Musculoskeletal:        General: No tenderness. Normal range of motion.     Cervical back: Normal range of motion and neck supple. No rigidity. No muscular tenderness.     Right lower leg: No edema.     Left lower leg: No edema.     Comments: No kyphosis   Lymphadenopathy:     Cervical: No cervical adenopathy.  Skin:    General: Skin is warm and dry.     Coloration: Skin is not pale.     Findings: No erythema or rash.     Comments: Mildly tanned Solar lentigines diffusely   Neurological:     Mental Status: She is alert. Mental status is at baseline.     Cranial Nerves: No  cranial nerve deficit.     Motor: No abnormal muscle tone.     Coordination: Coordination normal.     Gait: Gait normal.     Deep Tendon Reflexes: Reflexes are normal and symmetric. Reflexes normal.  Psychiatric:        Mood and Affect: Mood normal.        Cognition and Memory: Cognition and memory normal.           Assessment & Plan:   Problem List Items Addressed This Visit       Endocrine   Goiter    History of hashimotis in past  No clinical changes No change in exam  No swallowing complaints  Continues levothyroxine       Hypothyroidism    TSH today  Taking levothyroxine daily   No clinical changes  Past history of hashimotis and stable goiter        Relevant Orders   TSH     Other   Prediabetes    A1c today  Trying to eat well  Running for exercise   disc imp of low glycemic diet and wt loss to prevent DM2       Relevant Orders   Hemoglobin A1c   Routine general medical examination at a health care facility - Primary    Reviewed health habits including diet and exercise and skin cancer prevention Reviewed appropriate screening tests for age  Also reviewed health mt list, fam hx and immunization status , as  well as social and family history   See HPI Labs reviewed and ordered Declines flu shot  Does self breast exams (mother had breast cancer in 42s)  Sees gyn= planning annual visit soon Pap utd 04/2022  Discussed fall prevention, supplements and exercise for bone density  No fam history of colon cancer Sees dermatology yearly (mother had melanoma), encouraged sun protection  PHQ 0 from June        Relevant Orders   TSH   Lipid panel   Comprehensive metabolic panel   CBC with Differential/Platelet   Screening for lipoid disorders    Lipid panel today with wellness labs

## 2023-07-18 NOTE — Assessment & Plan Note (Signed)
Reviewed health habits including diet and exercise and skin cancer prevention Reviewed appropriate screening tests for age  Also reviewed health mt list, fam hx and immunization status , as well as social and family history   See HPI Labs reviewed and ordered Declines flu shot  Does self breast exams (mother had breast cancer in 43s)  Sees gyn= planning annual visit soon Pap utd 04/2022  Discussed fall prevention, supplements and exercise for bone density  No fam history of colon cancer Sees dermatology yearly (mother had melanoma), encouraged sun protection  PHQ 0 from June

## 2023-07-19 ENCOUNTER — Encounter: Payer: Self-pay | Admitting: *Deleted

## 2023-08-29 DIAGNOSIS — L821 Other seborrheic keratosis: Secondary | ICD-10-CM | POA: Diagnosis not present

## 2023-08-29 DIAGNOSIS — D229 Melanocytic nevi, unspecified: Secondary | ICD-10-CM | POA: Diagnosis not present

## 2023-08-29 DIAGNOSIS — L814 Other melanin hyperpigmentation: Secondary | ICD-10-CM | POA: Diagnosis not present

## 2023-08-29 DIAGNOSIS — L578 Other skin changes due to chronic exposure to nonionizing radiation: Secondary | ICD-10-CM | POA: Diagnosis not present

## 2023-09-21 ENCOUNTER — Other Ambulatory Visit: Payer: Self-pay | Admitting: Family Medicine

## 2023-10-31 ENCOUNTER — Telehealth: Payer: Self-pay | Admitting: Family Medicine

## 2023-10-31 NOTE — Telephone Encounter (Signed)
Any future medication for the patient after today  needs to be sent here [id number] [facility] [phone] per patient request

## 2023-11-01 NOTE — Telephone Encounter (Signed)
Wrong office

## 2024-04-22 ENCOUNTER — Ambulatory Visit: Admitting: Family Medicine

## 2024-04-23 ENCOUNTER — Encounter: Payer: Self-pay | Admitting: Family Medicine

## 2024-04-23 ENCOUNTER — Ambulatory Visit: Admitting: Family Medicine

## 2024-04-23 VITALS — BP 108/70 | HR 62 | Temp 98.5°F | Ht 66.25 in | Wt 174.0 lb

## 2024-04-23 DIAGNOSIS — Z021 Encounter for pre-employment examination: Secondary | ICD-10-CM | POA: Insufficient documentation

## 2024-04-23 DIAGNOSIS — Z111 Encounter for screening for respiratory tuberculosis: Secondary | ICD-10-CM | POA: Diagnosis not present

## 2024-04-23 DIAGNOSIS — E663 Overweight: Secondary | ICD-10-CM | POA: Diagnosis not present

## 2024-04-23 NOTE — Patient Instructions (Signed)
 Keep up the good work with diet and exercise  Keep strength training at least 3 d per week   TB test today   I will sign paperwork when we get the result

## 2024-04-23 NOTE — Assessment & Plan Note (Signed)
 Pt needs PPD for upcoming employment Skin test given-pending result   No history of TB, symptoms or exposure

## 2024-04-23 NOTE — Progress Notes (Signed)
 Subjective:    Patient ID: Robin Pollard, female    DOB: 1990/10/11, 33 y.o.   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[employer]  Excited about new position     Needs TB testing / skin test  No known exposure or case of TB  Vision Wears contact lenses and had visit within the year   Hearing- no problems   Heart -no problems   Lungs -no problems   Lifting /carrying - no problems at all   Imms  Td 05/2021  Does not get flu shot  Utd other imms    Is taking wegovy 0.5 mg weekly  In [facility]- [facility]  Has lost 10 -15 lb  Had more eating issues working at home   Strength training-weights at home at least 3 d per week Also a runner  Walking on incline also      Patient Active Problem List   Diagnosis Date Noted   Screening-pulmonary TB 04/23/2024   Encounter for pre-employment examination 04/23/2024   Overweight (BMI 25.0-29.9) 04/23/2024   Routine general medical examination at a health care facility 05/14/2021   Prediabetes 06/22/2020   Screening for lipoid disorders 08/26/2015   Classic migraine 08/14/2015   Hypothyroidism 11/13/2009   Goiter 05/12/2008   Past Medical History:  Diagnosis Date   Classic migraine 08/14/2015   Goiter    Hashimoto's thyroiditis   H/O varicella    HPV (human papilloma virus) anogenital infection    not high risk   Hypothyroidism    Oral herpes    SVD (spontaneous vaginal delivery)    X 1   Past Surgical History:  Procedure Laterality Date   CYSTECTOMY     dermoid cyst   DILATION AND EVACUATION N/A 02/12/2015   Procedure: DILATATION AND EVACUATION WITH ULTRASOUND GUIDANCE;  Surgeon: Rosaline Cobble, MD;  Location: [facility];   Service: Gynecology;  Laterality: N/A;   OOPHORECTOMY  2001   left   teratoma removal     WISDOM TOOTH EXTRACTION     Social History   Tobacco Use   Smoking status: Never   Smokeless tobacco: Never  Substance Use Topics   Alcohol use: No    Alcohol/week: 0.0 standard drinks of alcohol   Drug use: No   Family History  Problem Relation Age of Onset   Urolithiasis Sister    Hypertension Mother    Kassie Pour White syndrome Mother    Urolithiasis Mother    Cancer Mother        breast also melanoma   Heart disease Maternal Grandmother        CAD   Diabetes Maternal Grandfather    Heart disease Paternal Grandfather        CAD   Hypertension Paternal Grandfather    Diabetes Paternal Grandfather    Hypertension Paternal Grandmother    Diabetes Paternal Grandmother    Heart disease Paternal Grandmother    Migraines Neg Hx    No Known Allergies Current Outpatient Medications on File Prior to Visit  Medication Sig Dispense Refill   SUMAtriptan  (IMITREX ) 100 MG tablet Take one pill by  mouth as needed for migraine (maximum one daily) 10 tablet 3   SYNTHROID  50 MCG tablet TAKE 1 TABLET BY MOUTH EVERY DAY BEFORE BREAKFAST 90 tablet 2   WEGOVY 0.5 MG/0.5ML SOAJ Inject 0.5 mg into the skin once a week.     No current facility-administered medications on file prior to visit.    Review of Systems  Constitutional:  Negative for activity change, appetite change, fatigue, fever and unexpected weight change.  HENT:  Negative for congestion, ear pain, rhinorrhea, sinus pressure and sore throat.   Eyes:  Negative for pain, redness and visual disturbance.  Respiratory:  Negative for cough, shortness of breath and wheezing.   Cardiovascular:  Negative for chest pain and palpitations.  Gastrointestinal:  Negative for abdominal pain, blood in stool, constipation and diarrhea.  Endocrine: Negative for polydipsia and polyuria.  Genitourinary:  Negative for dysuria, frequency and urgency.   Musculoskeletal:  Negative for arthralgias, back pain and myalgias.  Skin:  Negative for pallor and rash.  Allergic/Immunologic: Negative for environmental allergies.  Neurological:  Negative for dizziness, syncope and headaches.  Hematological:  Negative for adenopathy. Does not bruise/bleed easily.  Psychiatric/Behavioral:  Negative for decreased concentration and dysphoric mood. The patient is not nervous/anxious.        Objective:   Physical Exam Constitutional:      General: She is not in acute distress.    Appearance: Normal appearance. She is well-developed and normal weight. She is not ill-appearing or diaphoretic.  HENT:     Head: Normocephalic and atraumatic.  Eyes:     Conjunctiva/sclera: Conjunctivae normal.     Pupils: Pupils are equal, round, and reactive to light.  Neck:     Thyroid : No thyromegaly.     Vascular: No carotid bruit or JVD.  Cardiovascular:     Rate and Rhythm: Normal rate and regular rhythm.     Heart sounds: Normal heart sounds.     No gallop.  Pulmonary:     Effort: Pulmonary effort is normal. No respiratory distress.     Breath sounds: Normal breath sounds. No wheezing or rales.  Abdominal:     General: There is no distension or abdominal bruit.     Palpations: Abdomen is soft.  Musculoskeletal:     Cervical back: Normal range of motion and neck supple.     Right lower leg: No edema.     Left lower leg: No edema.  Lymphadenopathy:     Cervical: No cervical adenopathy.  Skin:    General: Skin is warm and dry.     Coloration: Skin is not pale.     Findings: No rash.  Neurological:     Mental Status: She is alert.     Coordination: Coordination normal.     Deep Tendon Reflexes: Reflexes are normal and symmetric. Reflexes normal.  Psychiatric:        Mood and Affect: Mood normal.           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

## 2024-04-23 NOTE — Assessment & Plan Note (Signed)
 Pt has lost 10-15 lb in program called [facility]  On wegovy 0.5 mg weeky and tolerates it well (per pt this is temporary) Is doing strength training to combat muscle loss Feels good overall  Better diet habits -notes this will improve further when not working at home   Bmi 27.8 today

## 2024-04-23 NOTE — Progress Notes (Signed)
 PPD Placement note Robin Pollard, 33 y.o. female is here today for placement of PPD test Reason for PPD test: Work Pt taken PPD test before: yes Verified in allergy area and with patient that they are not allergic to the products PPD is made of (Phenol or Tween). Yes Is patient taking any oral or IV steroid medication now or have they taken it in the last month? no Has the patient ever received the BCG vaccine?: no Has the patient been in recent contact with anyone known or suspected of having active TB disease?: no      Date of exposure (if applicable): n/a      Name of person they were exposed to (if applicable): n/a O: Alert and oriented in NAD. P:  PPD placed on 04/23/2024.  Patient advised to return for reading within 48-72 hours.

## 2024-04-23 NOTE — Assessment & Plan Note (Signed)
 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

## 2024-04-25 ENCOUNTER — Ambulatory Visit (INDEPENDENT_AMBULATORY_CARE_PROVIDER_SITE_OTHER)

## 2024-04-25 DIAGNOSIS — Z111 Encounter for screening for respiratory tuberculosis: Secondary | ICD-10-CM | POA: Diagnosis not present

## 2024-04-25 LAB — TB SKIN TEST
Induration: 0 mm
TB Skin Test: NEGATIVE

## 2024-04-25 NOTE — Progress Notes (Signed)
 PPD Reading Note PPD read and results entered in [employer]. Result: 0 mm induration. Interpretation: Negative If test not read within 48-72 hours of initial placement, patient advised to repeat in other arm 1-3 weeks after this test. Allergic reaction: no   Nurse visit to read PPD.

## 2024-05-03 ENCOUNTER — Other Ambulatory Visit: Payer: Self-pay | Admitting: Family Medicine

## 2024-08-04 ENCOUNTER — Other Ambulatory Visit: Payer: Self-pay | Admitting: Family Medicine

## 2024-08-05 ENCOUNTER — Other Ambulatory Visit: Payer: Self-pay | Admitting: Family Medicine

## 2024-08-06 NOTE — Telephone Encounter (Signed)
 Pt is overdue for CPE (fasting labs prior), needs thyroid  rechecked. Please schedule and then route back to me to refill

## 2024-08-06 NOTE — Telephone Encounter (Signed)
Please schedule annual exam with fasting labs prior

## 2024-08-07 NOTE — Telephone Encounter (Signed)
 Copied from CRM [id number]. Topic: Appointments - Appointment Scheduling >> Aug 07, 2024  8:49 AM Robin Pollard wrote: Patient/patient representative is calling to schedule an appointment. Refer to attachments for appointment information. Patient received a message to schedule her annual physical she scheduled for 09/18/2024 at 4 pm and would like to know if we can send her the orders to have labs done prior.

## 2024-08-07 NOTE — Telephone Encounter (Signed)
 Please schedule labs prior, PCP will place orders closer to appt date

## 2024-09-08 ENCOUNTER — Telehealth: Payer: Self-pay | Admitting: Family Medicine

## 2024-09-08 DIAGNOSIS — E039 Hypothyroidism, unspecified: Secondary | ICD-10-CM

## 2024-09-08 DIAGNOSIS — R7303 Prediabetes: Secondary | ICD-10-CM

## 2024-09-08 DIAGNOSIS — Z0000 Encounter for general adult medical examination without abnormal findings: Secondary | ICD-10-CM

## 2024-09-08 DIAGNOSIS — Z13220 Encounter for screening for lipoid disorders: Secondary | ICD-10-CM

## 2024-09-08 NOTE — Telephone Encounter (Signed)
-----   Message from Robin Pollard sent at 08/28/2024 10:33 AM EST ----- Regarding: Labs Mon 09/09/24 Hello,  Patient is coming in for CPE labs. Can we get orders please.   Thanks

## 2024-09-09 ENCOUNTER — Other Ambulatory Visit

## 2024-09-09 ENCOUNTER — Ambulatory Visit: Payer: Self-pay | Admitting: Family Medicine

## 2024-09-09 DIAGNOSIS — E039 Hypothyroidism, unspecified: Secondary | ICD-10-CM | POA: Diagnosis not present

## 2024-09-09 DIAGNOSIS — Z0000 Encounter for general adult medical examination without abnormal findings: Secondary | ICD-10-CM | POA: Diagnosis not present

## 2024-09-09 DIAGNOSIS — Z13220 Encounter for screening for lipoid disorders: Secondary | ICD-10-CM

## 2024-09-09 DIAGNOSIS — R7303 Prediabetes: Secondary | ICD-10-CM

## 2024-09-09 LAB — COMPREHENSIVE METABOLIC PANEL WITH GFR
ALT: 9 U/L (ref 0–35)
AST: 13 U/L (ref 0–37)
Albumin: 4.4 g/dL (ref 3.5–5.2)
Alkaline Phosphatase: 44 U/L (ref 39–117)
BUN: 16 mg/dL (ref 6–23)
CO2: 28 meq/L (ref 19–32)
Calcium: 9.5 mg/dL (ref 8.4–10.5)
Chloride: 103 meq/L (ref 96–112)
Creatinine, Ser: 0.75 mg/dL (ref 0.40–1.20)
GFR: 102.8 mL/min (ref >60.00)
Glucose, Bld: 83 mg/dL (ref 70–99)
Potassium: 4.8 meq/L (ref 3.5–5.1)
Sodium: 139 meq/L (ref 135–145)
Total Bilirubin: 0.5 mg/dL (ref 0.2–1.2)
Total Protein: 7.1 g/dL (ref 6.0–8.3)

## 2024-09-09 LAB — LIPID PANEL
Cholesterol: 172 mg/dL (ref 0–200)
HDL: 58.2 mg/dL (ref >39.00)
LDL Cholesterol: 102 mg/dL — ABNORMAL HIGH (ref 0–99)
NonHDL: 113.65
Total CHOL/HDL Ratio: 3
Triglycerides: 56 mg/dL (ref 0.0–149.0)
VLDL: 11.2 mg/dL (ref 0.0–40.0)

## 2024-09-09 LAB — CBC WITH DIFFERENTIAL/PLATELET
Basophils Absolute: 0 K/uL (ref 0.0–0.1)
Basophils Relative: 1 % (ref 0.0–3.0)
Eosinophils Absolute: 0.1 K/uL (ref 0.0–0.7)
Eosinophils Relative: 3.3 % (ref 0.0–5.0)
HCT: 41.3 % (ref 36.0–46.0)
Hemoglobin: 14 g/dL (ref 12.0–15.0)
Lymphocytes Relative: 34.8 % (ref 12.0–46.0)
Lymphs Abs: 1.4 K/uL (ref 0.7–4.0)
MCHC: 33.9 g/dL (ref 30.0–36.0)
MCV: 88.5 fl (ref 78.0–100.0)
Monocytes Absolute: 0.5 K/uL (ref 0.1–1.0)
Monocytes Relative: 12.5 % — ABNORMAL HIGH (ref 3.0–12.0)
Neutro Abs: 1.9 K/uL (ref 1.4–7.7)
Neutrophils Relative %: 48.4 % (ref 43.0–77.0)
Platelets: 238 K/uL (ref 150.0–400.0)
RBC: 4.67 Mil/uL (ref 3.87–5.11)
RDW: 12.6 % (ref 11.5–15.5)
WBC: 3.9 K/uL — ABNORMAL LOW (ref 4.0–10.5)

## 2024-09-09 LAB — HEMOGLOBIN A1C: Hgb A1c MFr Bld: 5.1 % (ref 4.6–6.5)

## 2024-09-09 LAB — TSH: TSH: 1.08 u[IU]/mL (ref 0.35–5.50)

## 2024-09-18 ENCOUNTER — Telehealth: Payer: Self-pay | Admitting: *Deleted

## 2024-09-18 ENCOUNTER — Ambulatory Visit: Admitting: Family Medicine

## 2024-09-18 ENCOUNTER — Encounter: Payer: Self-pay | Admitting: Family Medicine

## 2024-09-18 VITALS — BP 102/64 | HR 79 | Temp 98.2°F | Ht 66.0 in | Wt 160.0 lb

## 2024-09-18 DIAGNOSIS — Z0000 Encounter for general adult medical examination without abnormal findings: Secondary | ICD-10-CM | POA: Diagnosis not present

## 2024-09-18 DIAGNOSIS — R7303 Prediabetes: Secondary | ICD-10-CM

## 2024-09-18 DIAGNOSIS — E663 Overweight: Secondary | ICD-10-CM

## 2024-09-18 DIAGNOSIS — E049 Nontoxic goiter, unspecified: Secondary | ICD-10-CM

## 2024-09-18 DIAGNOSIS — E039 Hypothyroidism, unspecified: Secondary | ICD-10-CM | POA: Diagnosis not present

## 2024-09-18 NOTE — Patient Instructions (Addendum)
 Get back to some strength training  Add some strength training to your routine, this is important for bone and brain health and can reduce your risk of falls and help your body use insulin properly and regulate weight  Light weights, exercise bands , and internet videos are a good way to start  Yoga (chair or regular), machines , floor exercises or a gym with machines are also good options  Goal- 3 times weekly   (at least 15-20 minutes)   Also cardio/stay active   If you cannot fit in strength training then you should stop the zepbound   Talk to Dr Mat about breast cancer screening  ? If baseline mammogram at 33 would be warranted   For bone health - try and get 1000 international units of vitamin D3 daily

## 2024-09-18 NOTE — Telephone Encounter (Signed)
 Pt went to get her Synthroid  and the pharmacist told her she is paying to much and her PCP needs to do a pre-authorization to get her cost lowered. I did advise pt I don't see any forms or messages regarding this sent to us  from pharmacy but I would reach out to the PA dpt to see if that's something we do

## 2024-09-18 NOTE — Progress Notes (Unsigned)
 Subjective:    Patient ID: Robin Pollard, female    DOB: [birth year], 33 y.o.   MRN: [id number]  HPI  Here for health maintenance exam and to review chronic medical problems   Wt Readings from Last 3 Encounters:  09/18/24 160 lb (72.6 kg)  04/23/24 174 lb (78.9 kg)  07/18/23 172 lb (78 kg)   25.82 kg/m  Vitals:   09/18/24 1557  BP: 102/64  Pulse: 79  Temp: 98.2 F (36.8 C)  SpO2: 100%    Immunization History  Administered Date(s) Administered   Hpv-Unspecified 03/30/2007   Influenza Whole 09/11/2007, 07/08/2008   Influenza,inj,Quad PF,6+ Mos 08/06/2014, 08/26/2015, 12/18/2017   Influenza-Unspecified 06/04/2016   MMR 09/03/2012   Meningococcal polysaccharide vaccine (MPSV4) 01/17/2007   PPD Test 04/11/2011, 04/23/2024   Td 06/13/2002, 05/14/2021   Tdap 04/11/2011, 05/05/2016    Health Maintenance Due  Topic Date Due   Hepatitis C Screening  Never done   Hepatitis B Vaccines 19-59 Average Risk (1 of 3 - 19+ 3-dose series) Never done   Flu shot declines   Feels good but also tired   Is assist principal in a school  Unsure if she will stick with it    Self breast exam-no lumps / does have implants  Mother had breast cancer   Gyn health Pap 04/2022  Has gyn provider - time to schedule a visit   Colon cancer screening -no family   Bone health   Falls-none  Fractures- broken toe after stubbing it in the dark   Supplements - none    Exercise  Not enough  Goal is to strength train more   Has  Rower Kettle balls Treadmill  Weights     Derm care- had to re schedule /doctor on maternity leave , in process of re scheduling Goes yearly  Uses sun protection  Mother had melanoma     Mood    09/18/2024    4:02 PM 04/23/2024   10:00 AM 07/18/2023   11:26 AM 03/10/2023   12:27 PM 06/15/2022    4:19 PM  Depression screen PHQ 2/9  Decreased Interest 0 0 0 0 0  Down, Depressed, Hopeless 0 0 0 0 0  PHQ - 2 Score 0 0 0 0 0  Altered sleeping 0  0 1 0 0  Tired, decreased energy 3 0 1 0 0  Change in appetite 0 0 1 0 0  Feeling bad or failure about yourself  0 0 0 0 0  Trouble concentrating 0 0 0 0 0  Moving slowly or fidgety/restless 0 0 0 0 0  Suicidal thoughts 0 0 0 0 0  PHQ-9 Score 3 0  3  0  0   Difficult doing work/chores Not difficult at all Not difficult at all Not difficult at all Not difficult at all Not difficult at all     Data saved with a previous flowsheet row definition   Hypothyroidism  Pt has no clinical changes No change in energy level/ hair or skin/ edema and no tremor Lab Results  Component Value Date   TSH 1.08 09/09/2024    Levothyroxine  50 mcg daily   Prediabetes Lab Results  Component Value Date   HGBA1C 5.1 09/09/2024   HGBA1C 5.3 07/18/2023   HGBA1C 5.5 06/15/2022   Weight  Zepbound 5 mg weekly  Hard to find time to eat well      Lab Results  Component Value Date   NA 139 09/09/2024  K 4.8 09/09/2024   CO2 28 09/09/2024   GLUCOSE 83 09/09/2024   BUN 16 09/09/2024   CREATININE 0.75 09/09/2024   CALCIUM 9.5 09/09/2024   GFR 102.80 09/09/2024   GFRNONAA >60 08/09/2015   Lab Results  Component Value Date   ALT 9 09/09/2024   AST 13 09/09/2024   ALKPHOS 44 09/09/2024   BILITOT 0.5 09/09/2024   Lab Results  Component Value Date   WBC 3.9 (L) 09/09/2024   HGB 14.0 09/09/2024   HCT 41.3 09/09/2024   MCV 88.5 09/09/2024   PLT 238.0 09/09/2024   Cholesterol Lab Results  Component Value Date   CHOL 172 09/09/2024   CHOL 173 07/18/2023   CHOL 179 06/15/2022   Lab Results  Component Value Date   HDL 58.20 09/09/2024   HDL 62.30 07/18/2023   HDL 65.60 06/15/2022   Lab Results  Component Value Date   LDLCALC 102 (H) 09/09/2024   LDLCALC 95 07/18/2023   LDLCALC 100 (H) 06/15/2022   Lab Results  Component Value Date   TRIG 56.0 09/09/2024   TRIG 77.0 07/18/2023   TRIG 67.0 06/15/2022   Lab Results  Component Value Date   CHOLHDL 3 09/09/2024   CHOLHDL 3  07/18/2023   CHOLHDL 3 06/15/2022   No results found for: LDLDIRECT    Patient Active Problem List   Diagnosis Date Noted   Screening-pulmonary TB 04/23/2024   Overweight (BMI 25.0-29.9) 04/23/2024   Routine general medical examination at a health care facility 05/14/2021   Prediabetes 06/22/2020   Screening for lipoid disorders 08/26/2015   Classic migraine 08/14/2015   Hypothyroidism 11/13/2009   Goiter 05/12/2008   Past Medical History:  Diagnosis Date   Classic migraine 08/14/2015   Goiter    Hashimoto's thyroiditis   H/O varicella    HPV (human papilloma virus) anogenital infection    not high risk   Hypothyroidism    Oral herpes    SVD (spontaneous vaginal delivery)    X 1   Past Surgical History:  Procedure Laterality Date   CYSTECTOMY     dermoid cyst   DILATION AND EVACUATION N/A 02/12/2015   Procedure: DILATATION AND EVACUATION WITH ULTRASOUND GUIDANCE;  Surgeon: Rosaline Cobble, MD;  Location: [facility];  Service: Gynecology;  Laterality: N/A;   OOPHORECTOMY  2001   left   teratoma removal     WISDOM TOOTH EXTRACTION     Social History[1] Family History  Problem Relation Age of Onset   Urolithiasis Sister    Hypertension Mother    Kassie Pour White syndrome Mother    Urolithiasis Mother    Cancer Mother        breast also melanoma   Heart disease Maternal Grandmother        CAD   Diabetes Maternal Grandfather    Heart disease Paternal Grandfather        CAD   Hypertension Paternal Grandfather    Diabetes Paternal Grandfather    Hypertension Paternal Grandmother    Diabetes Paternal Grandmother    Heart disease Paternal Grandmother    Migraines Neg Hx    Allergies[2] Medications Ordered Prior to Encounter[3]  Review of Systems  Constitutional:  Positive for fatigue. Negative for activity change, appetite change, fever and unexpected weight change.  HENT:  Negative for congestion, ear pain, rhinorrhea, sinus pressure and sore throat.    Eyes:  Negative for pain, redness and visual disturbance.  Respiratory:  Negative for cough, shortness of breath and  wheezing.   Cardiovascular:  Negative for chest pain and palpitations.  Gastrointestinal:  Negative for abdominal pain, blood in stool, constipation and diarrhea.  Endocrine: Negative for polydipsia and polyuria.  Genitourinary:  Negative for dysuria, frequency and urgency.  Musculoskeletal:  Negative for arthralgias, back pain and myalgias.  Skin:  Negative for pallor and rash.  Allergic/Immunologic: Negative for environmental allergies.  Neurological:  Negative for dizziness, syncope and headaches.  Hematological:  Negative for adenopathy. Does not bruise/bleed easily.  Psychiatric/Behavioral:  Negative for decreased concentration and dysphoric mood. The patient is not nervous/anxious.        Objective:   Physical Exam Constitutional:      General: She is not in acute distress.    Appearance: Normal appearance. She is well-developed and normal weight. She is not ill-appearing or diaphoretic.  HENT:     Head: Normocephalic and atraumatic.     Right Ear: Tympanic membrane, ear canal and external ear normal.     Left Ear: Tympanic membrane, ear canal and external ear normal.     Nose: Nose normal. No congestion.     Mouth/Throat:     Mouth: Mucous membranes are moist.     Pharynx: Oropharynx is clear. No posterior oropharyngeal erythema.  Eyes:     General: No scleral icterus.    Extraocular Movements: Extraocular movements intact.     Conjunctiva/sclera: Conjunctivae normal.     Pupils: Pupils are equal, round, and reactive to light.  Neck:     Thyroid : No thyromegaly.     Vascular: No carotid bruit or JVD.     Comments: Baseline thyroid  enl  Cardiovascular:     Rate and Rhythm: Normal rate and regular rhythm.     Pulses: Normal pulses.     Heart sounds: Normal heart sounds.     No gallop.  Pulmonary:     Effort: Pulmonary effort is normal. No respiratory  distress.     Breath sounds: Normal breath sounds. No wheezing.     Comments: Good air exch Chest:     Chest wall: No tenderness.  Abdominal:     General: Bowel sounds are normal. There is no distension or abdominal bruit.     Palpations: Abdomen is soft. There is no mass.     Tenderness: There is no abdominal tenderness.     Hernia: No hernia is present.  Genitourinary:    Comments: Breast exam: No mass, nodules, thickening, tenderness, bulging, retraction, inflamation, nipple discharge or skin changes noted.  No axillary or clavicular LA.     Musculoskeletal:        General: No tenderness. Normal range of motion.     Cervical back: Normal range of motion and neck supple. No rigidity. No muscular tenderness.     Right lower leg: No edema.     Left lower leg: No edema.     Comments: No kyphosis   Lymphadenopathy:     Cervical: No cervical adenopathy.  Skin:    General: Skin is warm and dry.     Coloration: Skin is not pale.     Findings: No erythema or rash.     Comments: Solar lentigines diffusely   Neurological:     Mental Status: She is alert. Mental status is at baseline.     Cranial Nerves: No cranial nerve deficit.     Motor: No abnormal muscle tone.     Coordination: Coordination normal.     Gait: Gait normal.     Deep  Tendon Reflexes: Reflexes are normal and symmetric. Reflexes normal.  Psychiatric:        Mood and Affect: Mood normal.        Cognition and Memory: Cognition and memory normal.           Assessment & Plan:   Problem List Items Addressed This Visit       Endocrine   Hypothyroidism   Lab Results  Component Value Date   TSH 1.08 09/09/2024    Taking levothyroxine  50mcg daily   No clinical changes  Past history of hashimotis and stable goiter        Goiter   History of hashimotis in past  No clinical changes No change in exam  No swallowing complaints  Continues levothyroxine          Other   Routine general medical  examination at a health care facility - Primary   Reviewed health habits including diet and exercise and skin cancer prevention Reviewed appropriate screening tests for age  Also reviewed health Pollard list, fam hx and immunization status , as well as social and family history   See HPI Labs reviewed and ordered Health Maintenance  Topic Date Due   Hepatitis C Screening  Never done   Hepatitis B Vaccine (1 of 3 - 19+ 3-dose series) Never done   Flu Shot  12/31/2024*   COVID-19 Vaccine (1 - 2025-26 season) 10/04/2025*   Pap with HPV screening  04/19/2025   DTaP/Tdap/Td vaccine (5 - Td or Tdap) 05/15/2031   HIV Screening  Completed   Pneumococcal Vaccine  Aged Out   Meningitis B Vaccine  Aged Out   HPV Vaccine  Discontinued  *Topic was postponed. The date shown is not the original due date.    Planning gyn visit  Will discuss breast cancer screening, consider baseline mammo  Discussed fall prevention, supplements and exercise for bone density  Encouraged to start vitamin D3 Follow up planned with dermatology for screening in light of family history of melanoma  PHQ 3 due to fatigue       Prediabetes   Prediabetes  Lab Results  Component Value Date   HGBA1C 5.1 09/09/2024   HGBA1C 5.3 07/18/2023   HGBA1C 5.5 06/15/2022    disc imp of low glycemic diet and wt loss to prevent DM2  Taking tirzepatide from another practice for former obesity and weight maintenance  Encouraged to continue strength building exercise       Overweight (BMI 25.0-29.9)   Continues tirzapatide from another practice Discussed how this problem influences overall health and the risks it imposes  Reviewed plan for weight loss with lower calorie diet (via better food choices (lower glycemic and portion control) along with exercise building up to or more than 30 minutes 5 days per week including some aerobic activity and strength training             [1]  Social History Tobacco Use   Smoking status:  Never   Smokeless tobacco: Never  Substance Use Topics   Alcohol use: No    Alcohol/week: 0.0 standard drinks of alcohol   Drug use: No  [2] No Known Allergies [3]  Current Outpatient Medications on File Prior to Visit  Medication Sig Dispense Refill   SUMAtriptan  (IMITREX ) 100 MG tablet Take one pill by mouth as needed for migraine (maximum one daily) 10 tablet 3   SYNTHROID  50 MCG tablet TAKE 1 TABLET BY MOUTH DAILY BEFORE BREAKFAST 90 tablet 0  ZEPBOUND 5 MG/0.5ML Pen Inject 5 mg into the skin once a week.     No current facility-administered medications on file prior to visit.

## 2024-09-19 NOTE — Assessment & Plan Note (Signed)
History of hashimotis in past  No clinical changes No change in exam  No swallowing complaints  Continues levothyroxine

## 2024-09-19 NOTE — Assessment & Plan Note (Signed)
 Prediabetes  Lab Results  Component Value Date   HGBA1C 5.1 09/09/2024   HGBA1C 5.3 07/18/2023   HGBA1C 5.5 06/15/2022    disc imp of low glycemic diet and wt loss to prevent DM2  Taking tirzepatide from another practice for former obesity and weight maintenance  Encouraged to continue strength building exercise

## 2024-09-19 NOTE — Assessment & Plan Note (Signed)
 Continues tirzapatide from another practice Discussed how this problem influences overall health and the risks it imposes  Reviewed plan for weight loss with lower calorie diet (via better food choices (lower glycemic and portion control) along with exercise building up to or more than 30 minutes 5 days per week including some aerobic activity and strength training

## 2024-09-19 NOTE — Assessment & Plan Note (Signed)
 Reviewed health habits including diet and exercise and skin cancer prevention Reviewed appropriate screening tests for age  Also reviewed health mt list, fam hx and immunization status , as well as social and family history   See HPI Labs reviewed and ordered Health Maintenance  Topic Date Due   Hepatitis C Screening  Never done   Hepatitis B Vaccine (1 of 3 - 19+ 3-dose series) Never done   Flu Shot  12/31/2024*   COVID-19 Vaccine (1 - 2025-26 season) 10/04/2025*   Pap with HPV screening  04/19/2025   DTaP/Tdap/Td vaccine (5 - Td or Tdap) 05/15/2031   HIV Screening  Completed   Pneumococcal Vaccine  Aged Out   Meningitis B Vaccine  Aged Out   HPV Vaccine  Discontinued  *Topic was postponed. The date shown is not the original due date.    Planning gyn visit  Will discuss breast cancer screening, consider baseline mammo  Discussed fall prevention, supplements and exercise for bone density  Encouraged to start vitamin D3 Follow up planned with dermatology for screening in light of family history of melanoma  PHQ 3 due to fatigue

## 2024-09-19 NOTE — Assessment & Plan Note (Signed)
 Lab Results  Component Value Date   TSH 1.08 09/09/2024    Taking levothyroxine  50mcg daily   No clinical changes  Past history of hashimotis and stable goiter

## 2024-09-24 ENCOUNTER — Other Ambulatory Visit (HOSPITAL_COMMUNITY): Payer: Self-pay
